# Patient Record
Sex: Male | Born: 1990 | Race: Black or African American | Hispanic: No | Marital: Single | Smoking: Current every day smoker
Health system: Southern US, Community
[De-identification: ages and names within clinical notes are randomized; demographics above are authoritative.]

## PROBLEM LIST (undated history)

## (undated) ENCOUNTER — Ambulatory Visit (HOSPITAL_COMMUNITY): Admission: EM | Payer: Medicaid Other

---
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
## 2012-08-12 ENCOUNTER — Encounter (HOSPITAL_COMMUNITY): Payer: Self-pay | Admitting: *Deleted

## 2012-08-12 ENCOUNTER — Emergency Department (HOSPITAL_COMMUNITY): Payer: Self-pay

## 2012-08-12 ENCOUNTER — Emergency Department (HOSPITAL_COMMUNITY)
Admission: EM | Admit: 2012-08-12 | Discharge: 2012-08-12 | Disposition: A | Discharge status: Home / Self Care | Payer: Self-pay | Attending: Emergency Medicine | Admitting: Emergency Medicine

## 2012-08-12 DIAGNOSIS — R509 Fever, unspecified: Secondary | ICD-10-CM | POA: Insufficient documentation

## 2012-08-12 DIAGNOSIS — IMO0001 Reserved for inherently not codable concepts without codable children: Secondary | ICD-10-CM | POA: Insufficient documentation

## 2012-08-12 DIAGNOSIS — R51 Headache: Secondary | ICD-10-CM | POA: Insufficient documentation

## 2012-08-12 DIAGNOSIS — R1110 Vomiting, unspecified: Secondary | ICD-10-CM | POA: Insufficient documentation

## 2012-08-12 DIAGNOSIS — F17200 Nicotine dependence, unspecified, uncomplicated: Secondary | ICD-10-CM | POA: Insufficient documentation

## 2012-08-12 DIAGNOSIS — J3489 Other specified disorders of nose and nasal sinuses: Secondary | ICD-10-CM | POA: Insufficient documentation

## 2012-08-12 DIAGNOSIS — B9789 Other viral agents as the cause of diseases classified elsewhere: Secondary | ICD-10-CM | POA: Insufficient documentation

## 2012-08-12 DIAGNOSIS — R197 Diarrhea, unspecified: Secondary | ICD-10-CM | POA: Insufficient documentation

## 2012-08-12 DIAGNOSIS — B349 Viral infection, unspecified: Secondary | ICD-10-CM

## 2012-08-12 MED ORDER — OXYCODONE-ACETAMINOPHEN 5-325 MG PO TABS
2.0000 | ORAL_TABLET | Freq: Once | ORAL | Status: AC
  Administered 2012-08-12: 1 via ORAL
  Filled 2012-08-12: qty 1

## 2012-08-12 MED ORDER — ONDANSETRON 4 MG PO TBDP
4.0000 mg | ORAL_TABLET | Freq: Once | ORAL | Status: AC
  Administered 2012-08-12: 4 mg via ORAL
  Filled 2012-08-12: qty 1

## 2012-08-12 MED ORDER — ONDANSETRON HCL 4 MG PO TABS: 4.0000 mg | ORAL_TABLET | Freq: Three times a day (TID) | ORAL | Status: DC | PRN

## 2012-08-12 MED ORDER — OXYCODONE-ACETAMINOPHEN 5-325 MG PO TABS: ORAL_TABLET | ORAL | Status: DC

## 2012-08-12 NOTE — ED Provider Notes (Signed)
History     CSN: [id number]  Arrival date & time 08/12/12  5284   First MD Initiated Contact with Patient 08/12/12 1037      Chief Complaint  Patient presents with  . Influenza    (Consider location/radiation/quality/duration/timing/severity/associated sxs/prior treatment) HPI  TANDRE CONLY is a 21 y.o. male complaining of sore throat, HA (5/`10, generalized), rhinorrhea,  f/c/n/v/d starting 4 days ago. Pt think he has had flu shot this year. Denies SOB, sick contacts, CP.   History reviewed. No pertinent past medical history.  History reviewed. No pertinent past surgical history.  History reviewed. No pertinent family history.  History  Substance Use Topics  . Smoking status: Current Every Day Smoker    Types: Cigarettes  . Smokeless tobacco: Not on file  . Alcohol Use: Yes      Review of Systems  Constitutional: Positive for fever.  HENT: Positive for rhinorrhea.   Gastrointestinal: Positive for vomiting and diarrhea.  Musculoskeletal: Positive for myalgias.  Neurological: Positive for headaches.  All other systems reviewed and are negative.    Allergies  Review of patient's allergies indicates no known allergies.  Home Medications  No current outpatient prescriptions on file.  BP 103/66  Pulse 82  Temp 98.1 F (36.7 C) (Oral)  Resp 20  SpO2 98%  Physical Exam  Nursing note and vitals reviewed. Constitutional: He is oriented to person, place, and time. He appears well-developed and well-nourished. No distress.  HENT:  Head: Normocephalic.  Eyes: Conjunctivae normal and EOM are normal.  Neck: Normal range of motion. Neck supple.       No Meningeal signs  Cardiovascular: Normal rate.   Pulmonary/Chest: Effort normal and breath sounds normal. No stridor. No respiratory distress. He has no wheezes. He has no rales. He exhibits no tenderness.  Abdominal: Soft. Bowel sounds are normal. He exhibits no distension and no mass. There is no tenderness.  There is no rebound and no guarding.  Musculoskeletal: Normal range of motion.  Neurological: He is alert and oriented to person, place, and time.  Psychiatric: He has a normal mood and affect.    ED Course  Procedures (including critical care time)  Labs Reviewed - No data to display No results found.   1. Viral syndrome       MDM   Patient refused IV or blood draw.  Lung sounds clear to auscultation. Patient is tolerating by mouth. Chest x-ray shows no infiltrate. He is afebrile at this time. Will attempt pain control and encourage oral hydration. He is outside the 48 hour window for Tamiflu.  Discussed case with attending who agrees with plan and stability to d/c to home.     Pt verbalized understanding and agrees with care plan. Outpatient follow-up and return precautions given.     New Prescriptions   ONDANSETRON (ZOFRAN) 4 MG TABLET    Take 1 tablet (4 mg total) by mouth every 8 (eight) hours as needed for nausea.   OXYCODONE-ACETAMINOPHEN (PERCOCET/ROXICET) 5-325 MG PER TABLET    1 to 2 tabs PO q6hrs  PRN for pain          Wynetta Emery, PA-C 08/12/12 1144

## 2012-08-12 NOTE — ED Notes (Signed)
Pt reports flu like symptoms since Thursday. Having n/v/d, bodyaches, fatigue and fever/chills. Mask on pt at triage.

## 2012-08-12 NOTE — ED Provider Notes (Signed)
Medical screening examination/treatment/procedure(s) were performed by non-physician practitioner and as supervising physician I was immediately available for consultation/collaboration.   Glynn Octave, MD 08/12/12 [phone]

## 2012-11-21 ENCOUNTER — Emergency Department (HOSPITAL_COMMUNITY)
Admission: EM | Admit: 2012-11-21 | Discharge: 2012-11-21 | Disposition: A | Discharge status: Home / Self Care | Payer: Self-pay | Attending: Emergency Medicine | Admitting: Emergency Medicine

## 2012-11-21 ENCOUNTER — Encounter (HOSPITAL_COMMUNITY): Payer: Self-pay | Admitting: Emergency Medicine

## 2012-11-21 DIAGNOSIS — N489 Disorder of penis, unspecified: Secondary | ICD-10-CM

## 2012-11-21 DIAGNOSIS — R300 Dysuria: Secondary | ICD-10-CM | POA: Insufficient documentation

## 2012-11-21 DIAGNOSIS — Z79899 Other long term (current) drug therapy: Secondary | ICD-10-CM | POA: Insufficient documentation

## 2012-11-21 DIAGNOSIS — N4889 Other specified disorders of penis: Secondary | ICD-10-CM | POA: Insufficient documentation

## 2012-11-21 DIAGNOSIS — N342 Other urethritis: Secondary | ICD-10-CM

## 2012-11-21 DIAGNOSIS — F17200 Nicotine dependence, unspecified, uncomplicated: Secondary | ICD-10-CM | POA: Insufficient documentation

## 2012-11-21 MED ORDER — ONDANSETRON 4 MG PO TBDP
4.0000 mg | ORAL_TABLET | Freq: Once | ORAL | Status: AC
  Administered 2012-11-21: 8 mg via ORAL

## 2012-11-21 MED ORDER — CEFTRIAXONE SODIUM 250 MG IJ SOLR
250.0000 mg | Freq: Once | INTRAMUSCULAR | Status: AC
  Administered 2012-11-21: 250 mg via INTRAMUSCULAR
  Filled 2012-11-21: qty 250

## 2012-11-21 MED ORDER — AZITHROMYCIN 250 MG PO TABS
2000.0000 mg | ORAL_TABLET | Freq: Once | ORAL | Status: AC
  Administered 2012-11-21: 2000 mg via ORAL
  Filled 2012-11-21: qty 8

## 2012-11-21 MED ORDER — PENICILLIN G BENZATHINE 1200000 UNIT/2ML IM SUSP
2.4000 10*6.[IU] | Freq: Once | INTRAMUSCULAR | Status: AC
  Administered 2012-11-21: 2.4 10*6.[IU] via INTRAMUSCULAR
  Filled 2012-11-21 (×2): qty 2

## 2012-11-21 MED ORDER — ONDANSETRON 4 MG PO TBDP
ORAL_TABLET | ORAL | Status: AC
  Administered 2012-11-21: 12:00:00
  Filled 2012-11-21: qty 2

## 2012-11-21 MED ORDER — LACTINEX PO CHEW: 1.0000 | CHEWABLE_TABLET | Freq: Three times a day (TID) | ORAL | Status: DC

## 2012-11-21 MED ORDER — LIDOCAINE HCL (PF) 1 % IJ SOLN
INTRAMUSCULAR | Status: AC
  Administered 2012-11-21: 11:00:00
  Filled 2012-11-21: qty 5

## 2012-11-21 MED ORDER — ONDANSETRON HCL 4 MG PO TABS: 4.0000 mg | ORAL_TABLET | Freq: Three times a day (TID) | ORAL | Status: DC | PRN

## 2012-11-21 MED ORDER — ONDANSETRON HCL 8 MG PO TABS: 4.0000 mg | ORAL_TABLET | Freq: Once | ORAL | Status: DC

## 2012-11-21 NOTE — ED Notes (Signed)
Pt gagging and spitting in trash can. States he "got hot and belly hurts".

## 2012-11-21 NOTE — ED Provider Notes (Signed)
Medical screening examination/treatment/procedure(s) were performed by non-physician practitioner and as supervising physician I was immediately available for consultation/collaboration.   Vannak Montenegro, MD 11/21/12 1541 

## 2012-11-21 NOTE — ED Notes (Signed)
Pt states he is feeling better.  
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
## 2012-11-21 NOTE — ED Notes (Signed)
Pt sitting on toilet feeling like he needs to have a BM

## 2012-11-22 LAB — GC/CHLAMYDIA PROBE AMP: GC Probe RNA: NEGATIVE

## 2012-11-23 NOTE — ED Notes (Signed)
+   Chlamydia  Patient treated with Rocephin and Zithromax-Chart appended per protocol MD.

## 2012-11-28 ENCOUNTER — Telehealth (HOSPITAL_COMMUNITY): Payer: Self-pay | Admitting: Emergency Medicine

## 2012-11-28 NOTE — ED Notes (Signed)
LVM requesting call back.

## 2012-12-01 ENCOUNTER — Telehealth (HOSPITAL_COMMUNITY): Payer: Self-pay | Admitting: Emergency Medicine

## 2013-07-24 ENCOUNTER — Encounter (HOSPITAL_COMMUNITY): Payer: Self-pay | Admitting: Emergency Medicine

## 2013-07-24 ENCOUNTER — Emergency Department (HOSPITAL_COMMUNITY)
Admission: EM | Admit: 2013-07-24 | Discharge: 2013-07-24 | Discharge status: Against Medical Advice | Payer: Self-pay | Attending: Emergency Medicine | Admitting: Emergency Medicine

## 2013-07-24 DIAGNOSIS — R1110 Vomiting, unspecified: Secondary | ICD-10-CM | POA: Insufficient documentation

## 2013-07-24 DIAGNOSIS — R05 Cough: Secondary | ICD-10-CM | POA: Insufficient documentation

## 2013-07-24 DIAGNOSIS — N509 Disorder of male genital organs, unspecified: Secondary | ICD-10-CM | POA: Insufficient documentation

## 2013-07-24 DIAGNOSIS — R51 Headache: Secondary | ICD-10-CM | POA: Insufficient documentation

## 2013-07-24 DIAGNOSIS — R059 Cough, unspecified: Secondary | ICD-10-CM | POA: Insufficient documentation

## 2013-07-24 DIAGNOSIS — F17200 Nicotine dependence, unspecified, uncomplicated: Secondary | ICD-10-CM | POA: Insufficient documentation

## 2013-07-24 LAB — COMPREHENSIVE METABOLIC PANEL
AST: 18 U/L (ref 0–37)
Alkaline Phosphatase: 53 U/L (ref 39–117)
Calcium: 9.6 mg/dL (ref 8.4–10.5)
GFR calc non Af Amer: 70 mL/min — ABNORMAL LOW (ref >90)
Sodium: 139 mEq/L (ref 135–145)
Total Bilirubin: 0.7 mg/dL (ref 0.3–1.2)
Total Protein: 7.6 g/dL (ref 6.0–8.3)

## 2013-07-24 LAB — CBC WITH DIFFERENTIAL/PLATELET
Eosinophils Absolute: 0 10*3/uL (ref 0.0–0.7)
Eosinophils Relative: 0 % (ref 0–5)
Lymphs Abs: 1.1 10*3/uL (ref 0.7–4.0)
MCH: 31.7 pg (ref 26.0–34.0)
Monocytes Absolute: 0.7 10*3/uL (ref 0.1–1.0)
Neutrophils Relative %: 80 % — ABNORMAL HIGH (ref 43–77)
Platelets: 174 10*3/uL (ref 150–400)
RBC: 5.4 MIL/uL (ref 4.22–5.81)

## 2013-07-24 LAB — URINALYSIS, ROUTINE W REFLEX MICROSCOPIC
Glucose, UA: NEGATIVE mg/dL
Leukocytes, UA: NEGATIVE
Nitrite: NEGATIVE
Specific Gravity, Urine: 1.027 (ref 1.005–1.030)
pH: 5.5 (ref 5.0–8.0)

## 2013-07-24 MED ORDER — ONDANSETRON 4 MG PO TBDP
8.0000 mg | ORAL_TABLET | Freq: Once | ORAL | Status: AC
  Administered 2013-07-24: 8 mg via ORAL
  Filled 2013-07-24: qty 2

## 2013-07-24 NOTE — ED Notes (Signed)
No answer when called 

## 2013-07-24 NOTE — ED Notes (Signed)
Pt at desk stating he was leaving due to wait time, encouraged to stay, pt ambulatory without distress to door

## 2013-07-24 NOTE — ED Notes (Signed)
The pt has been vomiting since this afternoon coughing  Also.  C/o testicle pain headache also

## 2013-07-24 NOTE — ED Notes (Signed)
Pt vomiting in triage 

## 2013-07-26 LAB — CULTURE, GROUP A STREP

## 2014-01-16 ENCOUNTER — Emergency Department (HOSPITAL_COMMUNITY)
Admission: EM | Admit: 2014-01-16 | Discharge: 2014-01-16 | Disposition: A | Discharge status: Home / Self Care | Payer: Self-pay | Attending: Emergency Medicine | Admitting: Emergency Medicine

## 2014-01-16 ENCOUNTER — Encounter (HOSPITAL_COMMUNITY): Payer: Self-pay | Admitting: Emergency Medicine

## 2014-01-16 DIAGNOSIS — Y9389 Activity, other specified: Secondary | ICD-10-CM | POA: Insufficient documentation

## 2014-01-16 DIAGNOSIS — F17200 Nicotine dependence, unspecified, uncomplicated: Secondary | ICD-10-CM | POA: Insufficient documentation

## 2014-01-16 DIAGNOSIS — W268XXA Contact with other sharp object(s), not elsewhere classified, initial encounter: Secondary | ICD-10-CM | POA: Insufficient documentation

## 2014-01-16 DIAGNOSIS — L03012 Cellulitis of left finger: Secondary | ICD-10-CM

## 2014-01-16 DIAGNOSIS — IMO0002 Reserved for concepts with insufficient information to code with codable children: Secondary | ICD-10-CM | POA: Insufficient documentation

## 2014-01-16 DIAGNOSIS — Z79899 Other long term (current) drug therapy: Secondary | ICD-10-CM | POA: Insufficient documentation

## 2014-01-16 DIAGNOSIS — S61209A Unspecified open wound of unspecified finger without damage to nail, initial encounter: Secondary | ICD-10-CM | POA: Insufficient documentation

## 2014-01-16 DIAGNOSIS — Y9289 Other specified places as the place of occurrence of the external cause: Secondary | ICD-10-CM | POA: Insufficient documentation

## 2014-01-16 MED ORDER — CEPHALEXIN 250 MG PO CAPS
500.0000 mg | ORAL_CAPSULE | Freq: Once | ORAL | Status: AC
  Administered 2014-01-16: 500 mg via ORAL
  Filled 2014-01-16: qty 2

## 2014-01-16 MED ORDER — CEPHALEXIN 500 MG PO CAPS: 500.0000 mg | ORAL_CAPSULE | Freq: Four times a day (QID) | ORAL | Status: DC

## 2014-01-16 MED ORDER — TRAMADOL HCL 50 MG PO TABS
50.0000 mg | ORAL_TABLET | Freq: Four times a day (QID) | ORAL | Status: DC | PRN
Start: 2014-01-16 — End: 2014-12-29

## 2014-01-16 MED ORDER — TETANUS-DIPHTH-ACELL PERTUSSIS 5-2.5-18.5 LF-MCG/0.5 IM SUSP
0.5000 mL | Freq: Once | INTRAMUSCULAR | Status: DC
Start: 2014-01-16 — End: 2014-01-16
  Filled 2014-01-16: qty 0.5

## 2014-01-16 NOTE — ED Provider Notes (Signed)
Medical screening examination/treatment/procedure(s) were performed by non-physician practitioner and as supervising physician I was immediately available for consultation/collaboration.   EKG Interpretation None        Junius ArgyleForrest S Ennis Delpozo, MD 01/16/14 1950

## 2014-01-16 NOTE — Discharge Instructions (Signed)
Take Tramadol as needed for pain. Take keflex as directed until gone. Refer to attached documents for more information. Return to the ED with worsening or concerning symptoms.
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
## 2014-01-16 NOTE — ED Notes (Signed)
Left index finger swollen after hurting it 5 days ago  Cut on fence

## 2014-01-17 NOTE — Discharge Planning (Signed)
[facility] Liaison did not get to see the patient, [facility] orange card information will be mailed to the address listed.  °  ° ° °

## 2014-02-01 ENCOUNTER — Emergency Department (HOSPITAL_COMMUNITY): Payer: No Typology Code available for payment source

## 2014-02-01 ENCOUNTER — Encounter (HOSPITAL_COMMUNITY): Payer: Self-pay | Admitting: Emergency Medicine

## 2014-02-01 ENCOUNTER — Emergency Department (HOSPITAL_COMMUNITY)
Admission: EM | Admit: 2014-02-01 | Discharge: 2014-02-01 | Disposition: A | Discharge status: Home / Self Care | Payer: No Typology Code available for payment source | Attending: Emergency Medicine | Admitting: Emergency Medicine

## 2014-02-01 DIAGNOSIS — S239XXA Sprain of unspecified parts of thorax, initial encounter: Secondary | ICD-10-CM | POA: Insufficient documentation

## 2014-02-01 DIAGNOSIS — F17200 Nicotine dependence, unspecified, uncomplicated: Secondary | ICD-10-CM | POA: Insufficient documentation

## 2014-02-01 DIAGNOSIS — S335XXA Sprain of ligaments of lumbar spine, initial encounter: Secondary | ICD-10-CM | POA: Insufficient documentation

## 2014-02-01 DIAGNOSIS — S0081XA Abrasion of other part of head, initial encounter: Secondary | ICD-10-CM

## 2014-02-01 DIAGNOSIS — S53401A Unspecified sprain of right elbow, initial encounter: Secondary | ICD-10-CM

## 2014-02-01 DIAGNOSIS — S7010XA Contusion of unspecified thigh, initial encounter: Secondary | ICD-10-CM | POA: Insufficient documentation

## 2014-02-01 DIAGNOSIS — Z79899 Other long term (current) drug therapy: Secondary | ICD-10-CM | POA: Insufficient documentation

## 2014-02-01 DIAGNOSIS — Z792 Long term (current) use of antibiotics: Secondary | ICD-10-CM | POA: Insufficient documentation

## 2014-02-01 DIAGNOSIS — S39012A Strain of muscle, fascia and tendon of lower back, initial encounter: Secondary | ICD-10-CM

## 2014-02-01 DIAGNOSIS — S29019A Strain of muscle and tendon of unspecified wall of thorax, initial encounter: Secondary | ICD-10-CM

## 2014-02-01 DIAGNOSIS — Y9389 Activity, other specified: Secondary | ICD-10-CM | POA: Insufficient documentation

## 2014-02-01 DIAGNOSIS — S0990XA Unspecified injury of head, initial encounter: Secondary | ICD-10-CM

## 2014-02-01 DIAGNOSIS — S139XXA Sprain of joints and ligaments of unspecified parts of neck, initial encounter: Secondary | ICD-10-CM | POA: Insufficient documentation

## 2014-02-01 DIAGNOSIS — S161XXA Strain of muscle, fascia and tendon at neck level, initial encounter: Secondary | ICD-10-CM

## 2014-02-01 DIAGNOSIS — IMO0002 Reserved for concepts with insufficient information to code with codable children: Secondary | ICD-10-CM | POA: Insufficient documentation

## 2014-02-01 DIAGNOSIS — Y92410 Unspecified street and highway as the place of occurrence of the external cause: Secondary | ICD-10-CM | POA: Insufficient documentation

## 2014-02-01 DIAGNOSIS — S298XXA Other specified injuries of thorax, initial encounter: Secondary | ICD-10-CM | POA: Insufficient documentation

## 2014-02-01 DIAGNOSIS — S7011XA Contusion of right thigh, initial encounter: Secondary | ICD-10-CM

## 2014-02-01 MED ORDER — NAPROXEN 500 MG PO TABS: 500.0000 mg | ORAL_TABLET | Freq: Two times a day (BID) | ORAL | Status: DC

## 2014-02-01 MED ORDER — CYCLOBENZAPRINE HCL 10 MG PO TABS: 10.0000 mg | ORAL_TABLET | Freq: Two times a day (BID) | ORAL | Status: DC | PRN

## 2014-02-01 MED ORDER — KETOROLAC TROMETHAMINE 60 MG/2ML IM SOLN
60.0000 mg | Freq: Once | INTRAMUSCULAR | Status: AC
  Administered 2014-02-01: 60 mg via INTRAMUSCULAR
  Filled 2014-02-01: qty 2

## 2014-02-01 MED ORDER — HYDROCODONE-ACETAMINOPHEN 5-325 MG PO TABS
1.0000 | ORAL_TABLET | Freq: Once | ORAL | Status: AC
Start: 2014-02-01 — End: 2014-02-01
  Administered 2014-02-01: 1 via ORAL
  Filled 2014-02-01: qty 1

## 2014-02-01 MED ORDER — TRAMADOL HCL 50 MG PO TABS: 50.0000 mg | ORAL_TABLET | Freq: Four times a day (QID) | ORAL | Status: DC | PRN
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
## 2014-02-01 NOTE — ED Notes (Addendum)
Patient transported to X-ray 

## 2014-02-01 NOTE — ED Provider Notes (Signed)
Medical screening examination/treatment/procedure(s) were performed by non-physician practitioner and as supervising physician I was immediately available for consultation/collaboration.  Flint Melter, MD 02/01/14 [phone]
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
## 2014-04-05 ENCOUNTER — Emergency Department (HOSPITAL_COMMUNITY)
Admission: EM | Admit: 2014-04-05 | Discharge: 2014-04-05 | Disposition: A | Discharge status: Home / Self Care | Payer: No Typology Code available for payment source | Attending: Emergency Medicine | Admitting: Emergency Medicine

## 2014-04-05 ENCOUNTER — Encounter (HOSPITAL_COMMUNITY): Payer: Self-pay | Admitting: Emergency Medicine

## 2014-04-05 DIAGNOSIS — A64 Unspecified sexually transmitted disease: Secondary | ICD-10-CM | POA: Insufficient documentation

## 2014-04-05 DIAGNOSIS — F17200 Nicotine dependence, unspecified, uncomplicated: Secondary | ICD-10-CM | POA: Insufficient documentation

## 2014-04-05 DIAGNOSIS — Z792 Long term (current) use of antibiotics: Secondary | ICD-10-CM | POA: Insufficient documentation

## 2014-04-05 DIAGNOSIS — Z791 Long term (current) use of non-steroidal anti-inflammatories (NSAID): Secondary | ICD-10-CM | POA: Insufficient documentation

## 2014-04-05 DIAGNOSIS — Z79899 Other long term (current) drug therapy: Secondary | ICD-10-CM | POA: Insufficient documentation

## 2014-04-05 MED ORDER — CEFTRIAXONE SODIUM 250 MG IJ SOLR
250.0000 mg | Freq: Once | INTRAMUSCULAR | Status: AC
  Administered 2014-04-05: 250 mg via INTRAMUSCULAR
  Filled 2014-04-05: qty 250

## 2014-04-05 MED ORDER — AZITHROMYCIN 250 MG PO TABS
1000.0000 mg | ORAL_TABLET | Freq: Once | ORAL | Status: AC
  Administered 2014-04-05: 1000 mg via ORAL
  Filled 2014-04-05: qty 4

## 2014-04-05 MED ORDER — LIDOCAINE HCL (PF) 1 % IJ SOLN
INTRAMUSCULAR | Status: AC
  Administered 2014-04-05: 5 mL via INTRADERMAL
  Filled 2014-04-05: qty 5

## 2014-04-05 MED ORDER — LIDOCAINE HCL (PF) 1 % IJ SOLN
2.0000 mL | Freq: Once | INTRAMUSCULAR | Status: AC
  Administered 2014-04-05: 5 mL via INTRADERMAL

## 2014-04-05 NOTE — ED Provider Notes (Signed)
CSN: [id number]     Arrival date & time 04/05/14  [id number] History  This chart was scribed for Jaynie Crumbleatyana Fantasia Jinkins, PA-C, working with Flint MelterElliott L Wentz, MD by Chestine SporeSoijett Blue, ED Scribe. The patient was seen in room TR05C/TR05C at 7:53 PM.     Chief Complaint  Patient presents with  . SEXUALLY TRANSMITTED DISEASE     The history is provided by the patient. No language interpreter was used.   HPI Comments: Dwayne Sanchez is a 23 y.o. male who presents to the Emergency Department complaining of a STD. He states that a partner informed him that he needs to be treated for chlamydia.  He states that the partner found out yesterday. He states that he is having associated symptoms of white discharge. He states that he has had gonorrhea before. He denies lesions and any other associated symptoms. He states that he does not want the shot, he just wants the pills.   History reviewed. No pertinent past medical history. History reviewed. No pertinent past surgical history. No family history on file. History  Substance Use Topics  . Smoking status: Current Every Day Smoker    Types: Cigarettes  . Smokeless tobacco: Not on file  . Alcohol Use: Yes    Review of Systems  Genitourinary: Positive for discharge.  Skin:       No lesions.      Allergies  Review of patient's allergies indicates no known allergies.  Home Medications   Prior to Admission medications   Medication Sig Start Date End Date Taking? Authorizing Provider  albuterol (PROVENTIL HFA;VENTOLIN HFA) 108 (90 BASE) MCG/ACT inhaler Inhale 2 puffs into the lungs every 6 (six) hours as needed for wheezing.    Historical Provider, MD  cephALEXin (KEFLEX) 500 MG capsule Take 1 capsule (500 mg total) by mouth 4 (four) times daily. 01/16/14   Kaitlyn Szekalski, PA-C  cyclobenzaprine (FLEXERIL) 10 MG tablet Take 1 tablet (10 mg total) by mouth 2 (two) times daily as needed for muscle spasms. 02/01/14   Nashla Althoff A Kaysey Berndt, PA-C  naproxen  (NAPROSYN) 500 MG tablet Take 1 tablet (500 mg total) by mouth 2 (two) times daily. 02/01/14   Shaley Leavens A Moroni Nester, PA-C  traMADol (ULTRAM) 50 MG tablet Take 1 tablet (50 mg total) by mouth every 6 (six) hours as needed. 01/16/14   Kaitlyn Szekalski, PA-C  traMADol (ULTRAM) 50 MG tablet Take 1 tablet (50 mg total) by mouth every 6 (six) hours as needed. 02/01/14   Sahvannah Rieser A Domanick Cuccia, PA-C   BP 116/61  Pulse 90  Temp(Src) 97.9 F (36.6 C) (Oral)  Resp 18  SpO2 96%  Physical Exam  Nursing note and vitals reviewed. Constitutional: He is oriented to person, place, and time. He appears well-developed and well-nourished. No distress.  HENT:  Head: Normocephalic and atraumatic.  Eyes: EOM are normal.  Neck: Neck supple. No tracheal deviation present.  Cardiovascular: Normal rate.   Pulmonary/Chest: Effort normal. No respiratory distress.  Musculoskeletal: Normal range of motion.  Neurological: He is alert and oriented to person, place, and time.  Skin: Skin is warm and dry.  Psychiatric: He has a normal mood and affect. His behavior is normal.    ED Course  Procedures (including critical care time) DIAGNOSTIC STUDIES: Oxygen Saturation is 96% on room air, normal by my interpretation.    COORDINATION OF CARE: 7:55 PM-Discussed treatment plan which includes Rocephin and Zithromax with pt at bedside and pt agreed to plan.   Labs Review Labs  Reviewed - No data to display  Imaging Review No results found.   EKG Interpretation None      MDM   Final diagnoses:  STD (male)    Patient is requesting treatment for his chlamydia after finding out that one out of his girlfriend tested positive. Patient refused examination or cultures. He was treated with Rocephin 250 mg IM, Zithromax 1 g by mouth. I discussed with him and instructed him to make sure that all of his partners are informed of his diagnoses and are treated. No sex for one week. Discussed safe sex practices.   Filed  Vitals:   04/05/14 1922  BP: 116/61  Pulse: 90  Temp: 97.9 F (36.6 C)  TempSrc: Oral  Resp: 18  SpO2: 96%     I personally performed the services described in this documentation, which was scribed in my presence. The recorded information has been reviewed and is accurate.    Lottie Mussel, PA-C 04/05/14 2310

## 2014-04-05 NOTE — Discharge Instructions (Signed)
Your were treated today for your STI. Make sure to avoid intercourse for 1 week. Always use protection. Follow up with health dept.    Sexually Transmitted Disease A sexually transmitted disease (STD) is a disease or infection that may be passed (transmitted) from person to person, usually during sexual activity. This may happen by way of saliva, semen, blood, vaginal mucus, or urine. Common STDs include:   Gonorrhea.   Chlamydia.   Syphilis.   HIV and AIDS.   Genital herpes.   Hepatitis B and C.   Trichomonas.   Human papillomavirus (HPV).   Pubic lice.   Scabies.  Mites.  Bacterial vaginosis. WHAT ARE CAUSES OF STDs? An STD may be caused by bacteria, a virus, or parasites. STDs are often transmitted during sexual activity if one person is infected. However, they may also be transmitted through nonsexual means. STDs may be transmitted after:   Sexual intercourse with an infected person.   Sharing sex toys with an infected person.   Sharing needles with an infected person or using unclean piercing or tattoo needles.  Having intimate contact with the genitals, mouth, or rectal areas of an infected person.   Exposure to infected fluids during birth. WHAT ARE THE SIGNS AND SYMPTOMS OF STDs? Different STDs have different symptoms. Some people may not have any symptoms. If symptoms are present, they may include:   Painful or bloody urination.   Pain in the pelvis, abdomen, vagina, anus, throat, or eyes.   A skin rash, itching, or irritation.  Growths, ulcerations, blisters, or sores in the genital and anal areas.  Abnormal vaginal discharge with or without bad odor.   Penile discharge in men.   Fever.   Pain or bleeding during sexual intercourse.   Swollen glands in the groin area.   Yellow skin and eyes (jaundice). This is seen with hepatitis.   Swollen testicles.  Infertility.  Sores and blisters in the mouth. HOW ARE STDs  DIAGNOSED? To make a diagnosis, your health care provider may:   Take a medical history.   Perform a physical exam.   Take a sample of any discharge to examine.  Swab the throat, cervix, opening to the penis, rectum, or vagina for testing.  Test a sample of your first morning urine.   Perform blood tests.   Perform a Pap test, if this applies.   Perform a colposcopy.   Perform a laparoscopy.  HOW ARE STDs TREATED? Treatment depends on the STD. Some STDs may be treated but not cured.   Chlamydia, gonorrhea, trichomonas, and syphilis can be cured with antibiotic medicine.   Genital herpes, hepatitis, and HIV can be treated, but not cured, with prescribed medicines. The medicines lessen symptoms.   Genital warts from HPV can be treated with medicine or by freezing, burning (electrocautery), or surgery. Warts may come back.   HPV cannot be cured with medicine or surgery. However, abnormal areas may be removed from the cervix, vagina, or vulva.   If your diagnosis is confirmed, your recent sexual partners need treatment. This is true even if they are symptom-free or have a negative culture or evaluation. They should not have sex until their health care providers say it is okay. HOW CAN I REDUCE MY RISK OF GETTING AN STD? Take these steps to reduce your risk of getting an STD:  Use latex condoms, dental dams, and water-soluble lubricants during sexual activity. Do not use petroleum jelly or oils.  Avoid having multiple sex partners.  Do not have sex with someone who has other sex partners.  Do not have sex with anyone you do not know or who is at high risk for an STD.  Avoid risky sex practices that can break your skin.  Do not have sex if you have open sores on your mouth or skin.  Avoid drinking too much alcohol or taking illegal drugs. Alcohol and drugs can affect your judgment and put you in a vulnerable position.  Avoid engaging in oral and anal sex  acts.  Get vaccinated for HPV and hepatitis. If you have not received these vaccines in the past, talk to your health care provider about whether one or both might be right for you.   If you are at risk of being infected with HIV, it is recommended that you take a prescription medicine daily to prevent HIV infection. This is called pre-exposure prophylaxis (PrEP). You are considered at risk if:  You are a man who has sex with other men (MSM).  You are a heterosexual man or woman and are sexually active with more than one partner.  You take drugs by injection.  You are sexually active with a partner who has HIV.  Talk with your health care provider about whether you are at high risk of being infected with HIV. If you choose to begin PrEP, you should first be tested for HIV. You should then be tested every 3 months for as long as you are taking PrEP.  WHAT SHOULD I DO IF I THINK I HAVE AN STD?  See your health care provider.   Tell your sexual partner(s). They should be tested and treated for any STDs.  Do not have sex until your health care provider says it is okay. WHEN SHOULD I GET IMMEDIATE MEDICAL CARE? Contact your health care provider right away if:   You have severe abdominal pain.  You are a man and notice swelling or pain in your testicles.  You are a woman and notice swelling or pain in your vagina. Document Released: 11/12/2002 Document Revised: 08/27/2013 Document Reviewed: 03/12/2013 [facility] Patient Information 2015 [employer]. This information is not intended to replace advice given to you by your health care provider. Make sure you discuss any questions you have with your health care provider.

## 2014-04-05 NOTE — ED Notes (Signed)
Pt states a partner informed him he needs to be treated for Chlamydia.  Pt denies any symptoms.

## 2014-04-06 NOTE — ED Provider Notes (Signed)
Medical screening examination/treatment/procedure(s) were performed by non-physician practitioner and as supervising physician I was immediately available for consultation/collaboration.  Flint MelterElliott L Claira Jeter, MD 04/06/14 [phone]

## 2014-12-09 ENCOUNTER — Encounter (HOSPITAL_COMMUNITY): Payer: Self-pay

## 2014-12-09 ENCOUNTER — Emergency Department (HOSPITAL_COMMUNITY)
Admission: EM | Admit: 2014-12-09 | Discharge: 2014-12-09 | Disposition: A | Discharge status: Home / Self Care | Payer: Self-pay | Attending: Emergency Medicine | Admitting: Emergency Medicine

## 2014-12-09 ENCOUNTER — Encounter (HOSPITAL_COMMUNITY): Payer: Self-pay | Admitting: Emergency Medicine

## 2014-12-09 ENCOUNTER — Emergency Department (HOSPITAL_COMMUNITY): Payer: No Typology Code available for payment source

## 2014-12-09 ENCOUNTER — Emergency Department (HOSPITAL_COMMUNITY): Payer: Self-pay

## 2014-12-09 ENCOUNTER — Emergency Department (HOSPITAL_COMMUNITY)
Admission: EM | Admit: 2014-12-09 | Discharge: 2014-12-09 | Discharge status: Against Medical Advice | Payer: Self-pay | Attending: Emergency Medicine | Admitting: Emergency Medicine

## 2014-12-09 DIAGNOSIS — Z792 Long term (current) use of antibiotics: Secondary | ICD-10-CM | POA: Insufficient documentation

## 2014-12-09 DIAGNOSIS — M79604 Pain in right leg: Secondary | ICD-10-CM

## 2014-12-09 DIAGNOSIS — S80212A Abrasion, left knee, initial encounter: Secondary | ICD-10-CM | POA: Insufficient documentation

## 2014-12-09 DIAGNOSIS — Y9289 Other specified places as the place of occurrence of the external cause: Secondary | ICD-10-CM | POA: Insufficient documentation

## 2014-12-09 DIAGNOSIS — Y9389 Activity, other specified: Secondary | ICD-10-CM | POA: Insufficient documentation

## 2014-12-09 DIAGNOSIS — Z720 Tobacco use: Secondary | ICD-10-CM | POA: Insufficient documentation

## 2014-12-09 DIAGNOSIS — S0990XA Unspecified injury of head, initial encounter: Secondary | ICD-10-CM | POA: Insufficient documentation

## 2014-12-09 DIAGNOSIS — S3992XA Unspecified injury of lower back, initial encounter: Secondary | ICD-10-CM | POA: Insufficient documentation

## 2014-12-09 DIAGNOSIS — Z791 Long term (current) use of non-steroidal anti-inflammatories (NSAID): Secondary | ICD-10-CM | POA: Insufficient documentation

## 2014-12-09 DIAGNOSIS — S8991XA Unspecified injury of right lower leg, initial encounter: Secondary | ICD-10-CM | POA: Insufficient documentation

## 2014-12-09 DIAGNOSIS — S8992XA Unspecified injury of left lower leg, initial encounter: Secondary | ICD-10-CM | POA: Insufficient documentation

## 2014-12-09 DIAGNOSIS — Y9301 Activity, walking, marching and hiking: Secondary | ICD-10-CM | POA: Insufficient documentation

## 2014-12-09 DIAGNOSIS — Y998 Other external cause status: Secondary | ICD-10-CM | POA: Insufficient documentation

## 2014-12-09 MED ORDER — NAPROXEN 250 MG PO TABS
500.0000 mg | ORAL_TABLET | Freq: Once | ORAL | Status: AC
  Administered 2014-12-09: 500 mg via ORAL
  Filled 2014-12-09: qty 2

## 2014-12-09 MED ORDER — TRAMADOL HCL 50 MG PO TABS: 50.0000 mg | ORAL_TABLET | Freq: Four times a day (QID) | ORAL | Status: DC | PRN

## 2014-12-09 MED ORDER — HYDROCODONE-ACETAMINOPHEN 5-325 MG PO TABS
1.0000 | ORAL_TABLET | Freq: Once | ORAL | Status: AC
  Administered 2014-12-09: 1 via ORAL
  Filled 2014-12-09: qty 1

## 2014-12-09 MED ORDER — NAPROXEN 500 MG PO TABS: 500.0000 mg | ORAL_TABLET | Freq: Two times a day (BID) | ORAL | Status: DC
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
## 2014-12-09 NOTE — Discharge Instructions (Signed)
Please follow the directions provided. Use the resource guide or the referral given to establish care with a primary care doctor for follow-up. Please take naproxen twice a day to help with pain and inflammation. You may use the tramadol for pain not relieved by the naproxen. May use heat or ice on your sore areas to help with discomfort. Don't hesitate to return for any new, worsening, or concerning symptoms.   WHEN SHOULD I SEEK IMMEDIATE MEDICAL CARE?  You should get help right away if:  You have confusion or drowsiness.  You feel sick to your stomach (nauseous) or have continued, forceful vomiting.  You have dizziness or unsteadiness that is getting worse.  You have severe, continued headaches not relieved by medicine. Only take over-the-counter or prescription medicines for pain, fever, or discomfort as directed by your health care provider.  You do not have normal function of the arms or legs or are unable to walk.  You notice changes in the black spots in the center of the colored part of your eye (pupil).  You have a clear or bloody fluid coming from your nose or ears.  You have a loss of vision. During the next 24 hours after the injury, you must stay with someone who can watch you for the warning signs. This person should contact local emergency services (911 in the U.S.) if you have seizures, you become unconscious, or you are unable to wake up.   Emergency Department Resource Guide 1) Find a Doctor and Pay Out of Pocket Although you won't have to find out who is covered by your insurance plan, it is a good idea to ask around and get recommendations. You will then need to call the office and see if the doctor you have chosen will accept you as a new patient and what types of options they offer for patients who are self-pay. Some doctors offer discounts or will set up payment plans for their patients who do not have insurance, but you will need to ask so you aren't surprised when you get  to your appointment.  2) Contact Your Local Health Department Not all health departments have doctors that can see patients for sick visits, but many do, so it is worth a call to see if yours does. If you don't know where your local health department is, you can check in your phone book. The CDC also has a tool to help you locate your state's health department, and many state websites also have listings of all of their local health departments.  3) Find a Walk-in Clinic If your illness is not likely to be very severe or complicated, you may want to try a walk in clinic. These are popping up all over the country in pharmacies, drugstores, and shopping centers. They're usually staffed by nurse practitioners or physician assistants that have been trained to treat common illnesses and complaints. They're usually fairly quick and inexpensive. However, if you have serious medical issues or chronic medical problems, these are probably not your best option.  No Primary Care Doctor: - Call Health Connect at  [phone] - they can help you locate a primary care doctor that  accepts your insurance, provides certain services, etc. - Physician Referral Service- [phone]  Chronic Pain Problems: Organization         Address  Phone   Notes  [facility]  [phone]) [phone] Patients need to be referred by their primary care doctor.   Medication Assistance: Organization  Address  Phone   Notes  [facility] Medication [facility] [address] [place], [place] [address] [phone]) [phone] --Must be a resident of [place] [facility] [place] -- Must have NO insurance coverage whatsoever (no Medicaid/ Medicare, etc.) -- The pt. MUST have a primary care doctor that directs their care regularly and follows them in the community   MedAssist  [phone]) [phone]   [employer]  [phone]    Agencies that provide inexpensive medical care: Organization         Address  Phone    Notes  [facility]  [phone]) [phone]   [facility]    [phone]   [facility] [address] [place], [place] [address] [phone]) [phone]   [facility] [address], [place] [phone]) [phone]   Planned Parenthood    [phone]) [phone]   [facility]    [phone]   [facility]  [address], [place] Phone:  [phone], Fax:  [phone] Hours of Operation:  9 am - 6 pm, M-F.  Also accepts Medicaid/Medicare and self-pay.  [facility]  [address], [place] Phone: [phone], Fax: [phone]. Hours of Operation:  8:30 am - 5:30 pm, M-F.  Also accepts Medicaid and self-pay.  [facility] [place] [address], [place] Phone: [phone](336) [phone]   [facility] [address], [place], [place] [phone], Ext. [phone] Mondays & Thursdays: 7-9 AM.  First 15 patients are seen on a first come, first serve basis.    Medicaid-accepting [facility] Providers:  Organization         Address  Phone   Notes  [facility] [address], [place] [phone] Also accepts self-pay patients.  [facility] [address], [place]  [phone]) [phone]   [facility] [address], [place] [phone]) [phone]   [facility] [address] [place] [address], [place] [phone]) [phone]   Renaye RakersVeita Bland [address], [place]   [phone] Only accepts [place] Access [place]icaid patients after they have their name applied to their card.   Self-Pay (no insurance) in [facility]:  Organization         Address  Phone   Notes  Sickle Cell Patients, [facility] [address], [place] [phone]) [phone]   [facility] [address], [place] [phone]   [facility] [place]  [address], [place] [phone]) [phone]   [facility]/Dr. Osei-Bonsu  [address] [place] [address], [place] or [phone], [place] [phone]) [phone] Phone number for both [place] and [place] locations is the same.  [facility] and [facility] [address], [place] [phone]) [phone]   [facility] [place] [address] [place] [address], [place] or [address] [phone]   [facility] [address], [place] [phone](336) [phone], phone; [phone]) [phone], fax Sees patients 1st and 3rd Saturday of every month.  Must not qualify for public or private insurance (i.e. Medicaid, Medicare, [place] Health Choice, Veterans' Benefits)  Household income should be no more than 200% of the poverty level The clinic cannot treat you if you are pregnant or think you  are pregnant  Sexually transmitted diseases are not treated at the clinic.    Dental Care: Organization         Address  Phone  Notes  [facility] Department of [facility] [address], [place] [phone]) [phone] Accepts children up to age 22 who are enrolled in [place]icaid or [place] Health Choice; pregnant women with a Medicaid card; and children who have applied for Medicaid or [place] Health Choice, but were declined, whose parents can pay a reduced fee at time of service.  [facility] of [facility] [place]  [address], [place] [phone]) [phone] Accepts children up to age 22 who are enrolled in [place]icaid or [place] Health Choice; pregnant women with a Medicaid card; and children who have applied for Medicaid or [place] Health Choice, but were declined, whose parents can pay a reduced fee at time of service.  [facility]  [address], [place] [phone]) [phone] Patients are seen by appointment only. Walk-ins are not accepted. [facility] will see patients 22 years of age  and older. Monday - Tuesday (8am-5pm) Most Wednesdays (8:30-5pm) $30 per visit, cash only  [facility]  [address], [facility] [phone]) [phone] Patients are seen by appointment only. Walk-ins are not accepted. [facility] will see patients 22 years of age and older. One Wednesday Evening (Monthly: Volunteer Based).  $30 per visit, cash only  [employer] [facility]  [phone] for adults; Children under age 554, call Graduate Pediatric Dentistry at [phone]. Children aged 234-14, please call [phone] to request a pediatric application.  Dental services are provided in all areas of dental care including fillings, crowns and bridges, complete and partial dentures, implants, gum treatment, root canals, and extractions. Preventive care is also provided. Treatment is provided to both adults and children. Patients are selected via a lottery and there is often a waiting list.   [facility] [address], [place]  [phone]) [phone] [email or web address]   [facility] [address], [place], [place] [phone], Ext. [phone] Second and Fourth Thursday of each month, opens at 6:30 AM; Clinic ends at 9 AM.  Patients are seen on a first-come first-served basis, and a limited number are seen during each clinic.   [facility]  [address], [place], [place] [phone]) [phone]   Eligibility Requirements You must have lived in [place], [place], or [place] counties for at least the last three months.   You cannot be eligible for state or federal sponsored National Cityhealthcare insurance, including CIGNAVeterans Administration, [place]icaid, or Har[employer] EntertainmentMedicare.   You generally cannot be eligible for healthcare insurance through your employer.    How to apply: Eligibility screenings are held every Tuesday and Wednesday afternoon from 1:00 pm until 4:00 pm. You do not need an appointment for the interview!  [facility] [address], [place], [place] [phone]   [facility]  [phone]   [facility]  [phone]   [facility]  [phone]    Behavioral Health Resources in the Community: Intensive Outpatient Programs Organization         Address  Phone  Notes  [facility] [address], [place], [place] [phone]   [facility] [address], [place], [place] [phone]   [facility]: [facility] Drug S[facility] [address], [place], [place]  [phone]   [facility] [address],  [place], [place] [phone] or [phone]   Substance Abuse Resources Organization         Address  Phone  Notes  [facility] Drug Services  [phone]   [facility]  [phone]   [facility]  [phone]   [facility]  [phone]   Residential & Outpatient Substance Abuse Program  [phone]   Psychological Services Organization         Address  Phone  Notes  [facility]  [phone]- [phone]   [facility]  [phone]- [phone]   [facility] [address], [place] [phone] or [phone]    Mobile Crisis Teams Organization         Address  Phone  Notes  [facility], Mobile Crisis Care Unit  [phone]   [facility]  [address] [place], [place] [phone]   Doristine LocksSharon DeEsch [address] [place] [phone]    Self-Help/Support Groups Organization         Address  Phone             Notes  [facility]ental Health Assoc. of [place] - variety of support groups  336- [phone] Call for more information  Narcotics Anonymous (NA), Caring Services [address], [place]  2 meetings at this location   Statisticianesidential Treatment Programs Organization         Address  Phone  Notes  [facility] [address],    [place]  [phone]   [facility]  [address], [place] [phone], [place], [place] [phone]   [facility] [address], [place] [phone] Admissions: 8am-3pm M-F  [facility] [address]    [place], [place] [phone]   [facility] [address], [place], [place] [phone]   [facility] [address]  [place], [place] [phone]   [facility] - Intensive Outpatient [address], [place], [place] [phone]   [facility] (Addiction Recovery Care [facility] [address]  [place], [place] [phone] or [phone]   [facility] Services (RTS) [address] [place], [place] [phone] Accepts Medicaid  [facility] [address]  [place]. [place] [phone] Substance Abuse/Addiction Treatment   [facility]al Health Resources Organization         Address  Phone  Notes  [facility]  [phone]) [phone]   Angie FavaJulie Brannon, PhD [address], [address] [place], [place]   [phone]) [phone] or [phone]) [phone]   [facility]   [address] [place], [place] [phone](336) [phone]   [facility] [address], [place], [place] [phone]) [phone] Insurance/Medicaid/sponsorship through [facility]  Faith and Families [address]                                    [place], [place] [phone](336) [phone] Therapy/tele-psych/case  [facility] [address].   [place], [place] [phone]) [phone]    Dr. Lolly MustacheArfeen  [phone](336) [phone]   [facility] [place]  [employer] [facility] 1) [address], [place] 2) [address], [place] 3)  [address], [place] [phone]) [phone]) [phone]  [phone](336) [phone]   [facility] Child Abuse Hotline [phone])  [phone] or [phone] (After Hours)

## 2014-12-09 NOTE — ED Notes (Signed)
Pt was requesting stronger pain medication but informed to fill his prescription of Tramadol provided by Joni ReiningNicole, [place] but he stated that it wasn't strong enough while using vulgar profanity. Informed patient he could take ibuprofen OTC per [place]ole, [place]. Pt A&Ox4, ambulatory with steady gait but wheeled out of ED via wheelchair, NAD.
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
## 2014-12-29 ENCOUNTER — Encounter (HOSPITAL_COMMUNITY): Payer: Self-pay | Admitting: Emergency Medicine

## 2014-12-29 ENCOUNTER — Emergency Department (HOSPITAL_COMMUNITY): Payer: Self-pay

## 2014-12-29 ENCOUNTER — Emergency Department (HOSPITAL_COMMUNITY)
Admission: EM | Admit: 2014-12-29 | Discharge: 2014-12-29 | Disposition: A | Discharge status: Home / Self Care | Payer: Self-pay | Attending: Emergency Medicine | Admitting: Emergency Medicine

## 2014-12-29 DIAGNOSIS — Z79899 Other long term (current) drug therapy: Secondary | ICD-10-CM | POA: Insufficient documentation

## 2014-12-29 DIAGNOSIS — Y939 Activity, unspecified: Secondary | ICD-10-CM | POA: Insufficient documentation

## 2014-12-29 DIAGNOSIS — R404 Transient alteration of awareness: Secondary | ICD-10-CM | POA: Insufficient documentation

## 2014-12-29 DIAGNOSIS — W01198A Fall on same level from slipping, tripping and stumbling with subsequent striking against other object, initial encounter: Secondary | ICD-10-CM | POA: Insufficient documentation

## 2014-12-29 DIAGNOSIS — Y929 Unspecified place or not applicable: Secondary | ICD-10-CM | POA: Insufficient documentation

## 2014-12-29 DIAGNOSIS — S0181XA Laceration without foreign body of other part of head, initial encounter: Secondary | ICD-10-CM | POA: Insufficient documentation

## 2014-12-29 DIAGNOSIS — S50311A Abrasion of right elbow, initial encounter: Secondary | ICD-10-CM | POA: Insufficient documentation

## 2014-12-29 DIAGNOSIS — Z720 Tobacco use: Secondary | ICD-10-CM | POA: Insufficient documentation

## 2014-12-29 DIAGNOSIS — Z792 Long term (current) use of antibiotics: Secondary | ICD-10-CM | POA: Insufficient documentation

## 2014-12-29 DIAGNOSIS — R569 Unspecified convulsions: Secondary | ICD-10-CM | POA: Insufficient documentation

## 2014-12-29 DIAGNOSIS — Y999 Unspecified external cause status: Secondary | ICD-10-CM | POA: Insufficient documentation

## 2014-12-29 LAB — COMPREHENSIVE METABOLIC PANEL
ALBUMIN: 4 g/dL (ref 3.5–5.2)
ALK PHOS: 58 U/L (ref 39–117)
ALT: 14 U/L (ref 0–53)
AST: 25 U/L (ref 0–37)
Anion gap: 12 (ref 5–15)
BUN: 8 mg/dL (ref 6–23)
CHLORIDE: 102 mmol/L (ref 96–112)
CO2: 24 mmol/L (ref 19–32)
CREATININE: 1.54 mg/dL — AB (ref 0.50–1.35)
Calcium: 9.6 mg/dL (ref 8.4–10.5)
GFR calc Af Amer: 73 mL/min — ABNORMAL LOW (ref >90)
GFR calc non Af Amer: 63 mL/min — ABNORMAL LOW (ref >90)
Glucose, Bld: 129 mg/dL — ABNORMAL HIGH (ref 70–99)
POTASSIUM: 3.8 mmol/L (ref 3.5–5.1)
Sodium: 138 mmol/L (ref 135–145)
Total Bilirubin: 0.8 mg/dL (ref 0.3–1.2)
Total Protein: 6.8 g/dL (ref 6.0–8.3)

## 2014-12-29 LAB — URINALYSIS, ROUTINE W REFLEX MICROSCOPIC
Bilirubin Urine: NEGATIVE
Glucose, UA: NEGATIVE mg/dL
HGB URINE DIPSTICK: NEGATIVE
Ketones, ur: NEGATIVE mg/dL
Leukocytes, UA: NEGATIVE
Nitrite: NEGATIVE
PROTEIN: 30 mg/dL — AB
Specific Gravity, Urine: 1.026 (ref 1.005–1.030)
UROBILINOGEN UA: 1 mg/dL (ref 0.0–1.0)
pH: 7 (ref 5.0–8.0)

## 2014-12-29 LAB — URINE MICROSCOPIC-ADD ON

## 2014-12-29 LAB — CBC WITH DIFFERENTIAL/PLATELET
BASOS ABS: 0 10*3/uL (ref 0.0–0.1)
Basophils Relative: 0 % (ref 0–1)
Eosinophils Absolute: 0 10*3/uL (ref 0.0–0.7)
Eosinophils Relative: 0 % (ref 0–5)
HCT: 44.4 % (ref 39.0–52.0)
Hemoglobin: 15.7 g/dL (ref 13.0–17.0)
LYMPHS ABS: 1.2 10*3/uL (ref 0.7–4.0)
Lymphocytes Relative: 11 % — ABNORMAL LOW (ref 12–46)
MCH: 31.3 pg (ref 26.0–34.0)
MCHC: 35.4 g/dL (ref 30.0–36.0)
MCV: 88.6 fL (ref 78.0–100.0)
Monocytes Absolute: 0.7 10*3/uL (ref 0.1–1.0)
Monocytes Relative: 6 % (ref 3–12)
NEUTROS PCT: 83 % — AB (ref 43–77)
Neutro Abs: 9.1 10*3/uL — ABNORMAL HIGH (ref 1.7–7.7)
PLATELETS: 198 10*3/uL (ref 150–400)
RBC: 5.01 MIL/uL (ref 4.22–5.81)
RDW: 12.8 % (ref 11.5–15.5)
WBC: 11.1 10*3/uL — ABNORMAL HIGH (ref 4.0–10.5)

## 2014-12-29 LAB — RAPID URINE DRUG SCREEN, HOSP PERFORMED
Amphetamines: NOT DETECTED
BARBITURATES: NOT DETECTED
BENZODIAZEPINES: POSITIVE — AB
Cocaine: NOT DETECTED
Opiates: NOT DETECTED
Tetrahydrocannabinol: POSITIVE — AB

## 2014-12-29 LAB — CBG MONITORING, ED: Glucose-Capillary: 115 mg/dL — ABNORMAL HIGH (ref 70–99)

## 2014-12-29 LAB — ETHANOL: Alcohol, Ethyl (B): <5 mg/dL (ref 0–9)

## 2014-12-29 NOTE — ED Provider Notes (Signed)
CSN: [id number]     Arrival date & time 12/29/14  1941 History   First MD Initiated Contact with Patient 12/29/14 1953     Chief Complaint  Patient presents with  . Seizures     (Consider location/radiation/quality/duration/timing/severity/associated sxs/prior Treatment) The history is provided by the patient, medical records and the EMS personnel. No language interpreter was used.     Dwayne Sanchez is a 24 y.o. male  with no major medical hx presents to the Emergency Department complaining the EMS after witnessed seizure activity and persistent altered mental status. Per EMS witnesses state patient fell forward striking his face on the ground.  Afterwards he was found by EMS to be postictal and combative. Per EMS patient vomited a large amount in the back of the truck.  Level V caveat for altered mental status.  Patient reports drinking alcohol today but answers few other questions.   History reviewed. No pertinent past medical history. History reviewed. No pertinent past surgical history. No family history on file. History  Substance Use Topics  . Smoking status: Current Every Day Smoker    Types: Cigarettes  . Smokeless tobacco: Not on file  . Alcohol Use: Yes    Review of Systems  Unable to perform ROS: Mental status change      Allergies  Review of patient's allergies indicates no known allergies.  Home Medications   Prior to Admission medications   Medication Sig Start Date End Date Taking? Authorizing Provider  albuterol (PROVENTIL HFA;VENTOLIN HFA) 108 (90 BASE) MCG/ACT inhaler Inhale 2 puffs into the lungs every 6 (six) hours as needed for wheezing.   Yes Historical Provider, MD  cephALEXin (KEFLEX) 500 MG capsule Take 1 capsule (500 mg total) by mouth 4 (four) times daily. 01/16/14   Kaitlyn Szekalski, PA-C  traMADol (ULTRAM) 50 MG tablet Take 1 tablet (50 mg total) by mouth every 6 (six) hours as needed. 02/01/14   Tatyana Kirichenko, PA-C  traMADol (ULTRAM)  50 MG tablet Take 1 tablet (50 mg total) by mouth every 6 (six) hours as needed. 12/09/14   Harle BattiestElizabeth Tysinger, NP   BP 107/67 mmHg  Pulse 77  Resp 17  Ht 6\' 1"  (1.854 m)  Wt 152 lb (68.947 kg)  BMI 20.06 kg/m2  SpO2 99% Physical Exam  Constitutional: He appears well-developed and well-nourished. No distress.  Awake, alert, nontoxic appearance  HENT:  Head: Normocephalic and atraumatic.  Right Ear: No hemotympanum.  Left Ear: No hemotympanum.  Nose: Nose normal.  Mouth/Throat: Oropharynx is clear and moist. Mucous membranes are not dry. Normal dentition. No dental caries. No oropharyngeal exudate.  Laceration to the right side of the time with minimal bleeding No dental trauma Moist membranes  Eyes: Conjunctivae and EOM are normal. Pupils are equal, round, and reactive to light. No scleral icterus.  No horizontal, vertical or rotational nystagmus  Neck: Normal range of motion. Neck supple.  C-collar in place No midline or paraspinal tenderness  Cardiovascular: Normal rate, regular rhythm, normal heart sounds and intact distal pulses.   Pulmonary/Chest: Effort normal and breath sounds normal. No respiratory distress. He has no wheezes. He has no rales.  Equal chest expansion Clear and equal breath sounds No chest tenderness  Abdominal: Soft. Bowel sounds are normal. He exhibits no mass. There is no tenderness. There is no rebound and no guarding.  Abdomen soft and nontender  Musculoskeletal: Normal range of motion. He exhibits no edema.  Patient moves all extremities and all major joints voluntarily  Lymphadenopathy:    He has no cervical adenopathy.  Neurological: He is alert. He has normal reflexes. No cranial nerve deficit. GCS eye subscore is 4. GCS verbal subscore is 4. GCS motor subscore is 6.  Reflex Scores:      Bicep reflexes are 2+ on the right side and 2+ on the left side.      Brachioradialis reflexes are 2+ on the right side and 2+ on the left side.      Patellar  reflexes are 2+ on the right side and 2+ on the left side.      Achilles reflexes are 2+ on the right side and 2+ on the left side. GCS 14 - pt remains confused Patient with no obvious cranial nerve deficit that is minimally cooperative with evaluation 5/5 strength in bilateral upper and lower extremities; moves extremities without ataxia  Skin: Skin is warm and dry. No rash noted. He is not diaphoretic.  Abrasion to the right elbow  Psychiatric: He has a normal mood and affect. His behavior is normal. Judgment and thought content normal.  Nursing note and vitals reviewed.   ED Course  Procedures (including critical care time) Labs Review Labs Reviewed  URINE RAPID DRUG SCREEN (HOSP PERFORMED) - Abnormal; Notable for the following:    Benzodiazepines POSITIVE (*)    Tetrahydrocannabinol POSITIVE (*)    All other components within normal limits  COMPREHENSIVE METABOLIC PANEL - Abnormal; Notable for the following:    Glucose, Bld 129 (*)    Creatinine, Ser 1.54 (*)    GFR calc non Af Amer 63 (*)    GFR calc Af Amer 73 (*)    All other components within normal limits  CBC WITH DIFFERENTIAL/PLATELET - Abnormal; Notable for the following:    WBC 11.1 (*)    Neutrophils Relative % 83 (*)    Neutro Abs 9.1 (*)    Lymphocytes Relative 11 (*)    All other components within normal limits  URINALYSIS, ROUTINE W REFLEX MICROSCOPIC - Abnormal; Notable for the following:    Color, Urine AMBER (*)    Protein, ur 30 (*)    All other components within normal limits  CBG MONITORING, ED - Abnormal; Notable for the following:    Glucose-Capillary 115 (*)    All other components within normal limits  ETHANOL  URINE MICROSCOPIC-ADD ON    Imaging Review Dg Cervical Spine Complete  12/29/2014   CLINICAL DATA:  Seizures today, now with neck pain.  EXAM: CERVICAL SPINE  4+ VIEWS  COMPARISON:  None.  FINDINGS: C1 to the superior endplate of C6 is imaged on the provided lateral radiograph. There is  adequate evaluation of the C6-C7 intervertebral disc space on the provided swimmer's radiograph though there is persistent obscuration of the cervical thoracic junction secondary to overlying osseous and soft tissue structures.  Normal alignment of the cervical spine. No anterolisthesis or retrolisthesis. The bilateral facets appear normally aligned given obliquity.  The dens appears normally positioned between the lateral masses of C1.  Cervical vertebral body heights are preserved. Prevertebral soft tissues are normal.  Intervertebral disc space heights appear preserved.  The bilateral neural foramina appear widely patent given obliquity.  Regional soft tissues appear normal. Limited visualization of lung apices is normal.  IMPRESSION: No definite acute findings.   Electronically Signed   By: Simonne Come M.D.   On: 12/29/2014 21:02   Ct Head Wo Contrast  12/29/2014   CLINICAL DATA:  Seizure, headache  EXAM:  CT HEAD WITHOUT CONTRAST  TECHNIQUE: Contiguous axial images were obtained from the base of the skull through the vertex without intravenous contrast.  COMPARISON:  12/09/2014  FINDINGS: No evidence of parenchymal hemorrhage or extra-axial fluid collection. No mass lesion, mass effect, or midline shift.  No CT evidence of acute infarction.  Cerebral volume is within normal limits.  No ventriculomegaly.  The visualized paranasal sinuses are essentially clear. The mastoid air cells are unopacified.  No evidence of calvarial fracture.  IMPRESSION: Normal head CT.   Electronically Signed   By: Charline Bills M.D.   On: 12/29/2014 20:39     EKG Interpretation   Date/Time:  Monday December 29 2014 19:54:48 EDT Ventricular Rate:  93 PR Interval:  144 QRS Duration: 69 QT Interval:  334 QTC Calculation: 415 R Axis:   46 Text Interpretation:  Normal sinus rhythm Early repolarization No old  tracing to compare Confirmed by Ethelda Chick  MD, SAM [phone]) on 12/29/2014  8:01:07 PM      MDM   Final  diagnoses:  Seizure  Transient alteration of awareness   MUHANNAD BIGNELL presents with new onset seizure. He admits to alcohol usage tonight however due to his persistent altered mental status concerned about drug use as well. Will obtain head CT and lab workup.  10:29 PM Pt has returned to baseline.  He is ambulatory without gait disturbance.  He has full ROM of his neck without pain, no midline tenderness and negative x-rays.  GCS 15 at this time.  UDS positive for benzos and THC. No evidence of UTI or hypoglycemia. Labs reassuring. CT head unremarkable.  Patient now reports that he has been taking an unknown medication/drug off the street because it makes him feel high. Reports several months of routine usage of this stopping abruptly 3 days ago. He does not know the medication is.  Patient counseled on his drug and alcohol usage. Freckling close follow-up with [facility] neurology for further workup of his seizure disorder. Return to emergency department for repeat seizures.  BP 107/67 mmHg  Pulse 77  Resp 17  Ht  (1.854 m)  Wt 152 lb (68.947 kg)  BMI 20.06 kg/m2  SpO2 99%  The patient was discussed with and seen by Dr. Caprice Kluver who agrees with the treatment plan.      Dahlia Client Charlen Bakula, PA-C 12/29/14 2254  Doug Sou, MD 12/30/14 [phone]

## 2014-12-29 NOTE — ED Notes (Signed)
CBG: 115 

## 2014-12-29 NOTE — ED Provider Notes (Signed)
Patient reportedly had seizure earlier today. Tonic-clonic in nature. Patient has no recall of the event. Patient presently asymptomatic alert Glasgow Coma Score 15 HEENT exam no facial asymmetry neck supple nontender full range of motion. no distress neurologic gait normal Romberg normal pronator drift normal motor strength 5 over 5 overall Patient does not drive. He will be referred to neurology service as outpatient  Doug SouSam Cady Hafen, MD 12/29/14 2223

## 2014-12-29 NOTE — ED Notes (Signed)
Pt stable, ambulatory, denies any pain, states understanding of discharge instructions 

## 2014-12-29 NOTE — Discharge Instructions (Signed)
1. Medications: usual home medications 2. Treatment: rest, drink plenty of fluids,  3. Follow Up: Please followup with your primary doctor and [facility] in 2 days for discussion of your diagnoses and further evaluation after today's visit; if you do not have a primary care doctor use the resource guide provided to find one; Please return to the ER for her seizures, altered mental status or other concerns   Seizure, Adult A seizure is abnormal electrical activity in the brain. Seizures usually last from 30 seconds to 2 minutes. There are various types of seizures. Before a seizure, you may have a warning sensation (aura) that a seizure is about to occur. An aura may include the following symptoms:   Fear or anxiety.  Nausea.  Feeling like the room is spinning (vertigo).  Vision changes, such as seeing flashing lights or spots. Common symptoms during a seizure include:  A change in attention or behavior (altered mental status).  Convulsions with rhythmic jerking movements.  Drooling.  Rapid eye movements.  Grunting.  Loss of bladder and bowel control.  Bitter taste in the mouth.  Tongue biting. After a seizure, you may feel confused and sleepy. You may also have an injury resulting from convulsions during the seizure. HOME CARE INSTRUCTIONS   If you are given medicines, take them exactly as prescribed by your health care provider.  Keep all follow-up appointments as directed by your health care provider.  Do not swim or drive or engage in risky activity during which a seizure could cause further injury to you or others until your health care provider says it is OK.  Get adequate rest.  Teach friends and family what to do if you have a seizure. They should:  Lay you on the ground to prevent a fall.  Put a cushion under your head.  Loosen any tight clothing around your neck.  Turn you on your side. If vomiting occurs, this helps keep your airway clear.  Stay  with you until you recover.  Know whether or not you need emergency care. SEEK IMMEDIATE MEDICAL CARE IF:  The seizure lasts longer than 5 minutes.  The seizure is severe or you do not wake up immediately after the seizure.  You have an altered mental status after the seizure.  You are having more frequent or worsening seizures. Someone should drive you to the emergency department or call local emergency services (911 in [place] MAKE SURE YOU:  Understand these instructions.  Will watch your condition.  Will get help right away if you are not doing well or get worse. Document Released: 08/19/2000 Document Revised: 06/12/2013 Document Reviewed: 04/03/2013 [facility] Patient Information 2015 [place], [place]. This information is not intended to replace advice given to you by your health care provider. Make sure you discuss any questions you have with your health care provider.

## 2014-12-29 NOTE — ED Notes (Signed)
Pt arrives via [facility].  EMS reports bystanders witnessed clonic tonic seizure lasting 45 seconds, reports pt fell face down from standing. EMS reports possible post-ictal lasting 20 minutes, pt combative, pupils 2mm, pt vomited large amount in truck.  Pt AOx4, GCS 15, pupils 4mm, PERRLA.

## 2015-05-26 IMAGING — DX DG CERVICAL SPINE COMPLETE 4+V
6 series · 6 of 6 positions shown · non-contrast
Comparison: None.

CLINICAL DATA: Seizures today, now with neck pain.

EXAM:
CERVICAL SPINE  4+ VIEWS

[c-spine lat]
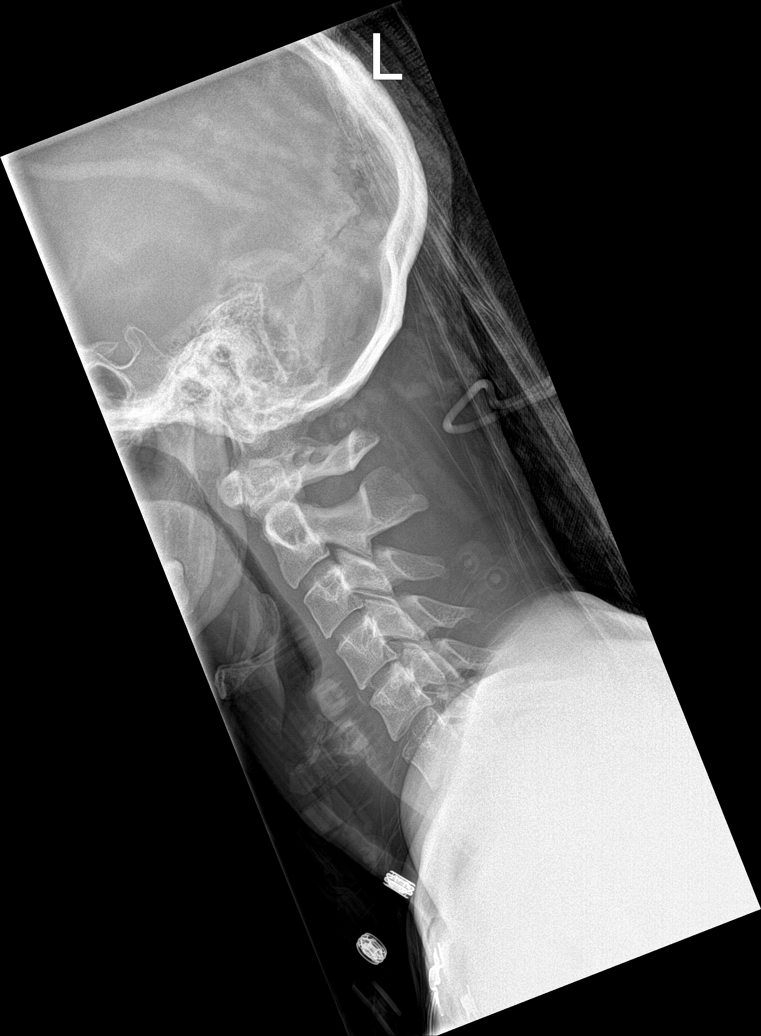

[c-spine obl (1 of 2)]
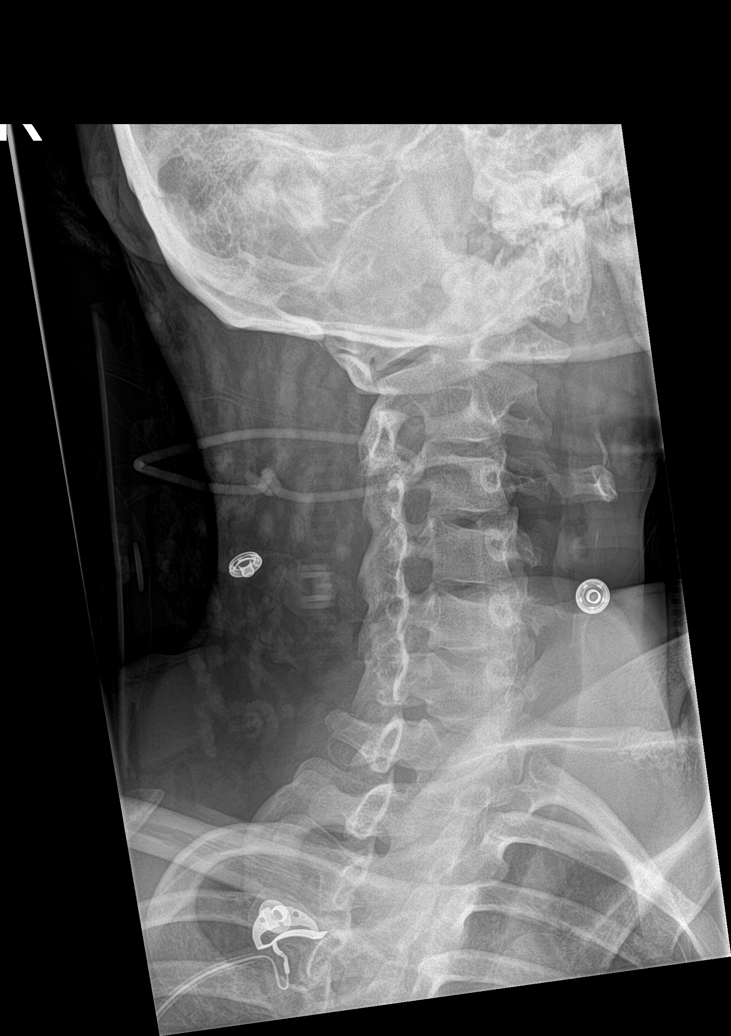

[c-spine obl (2 of 2)]
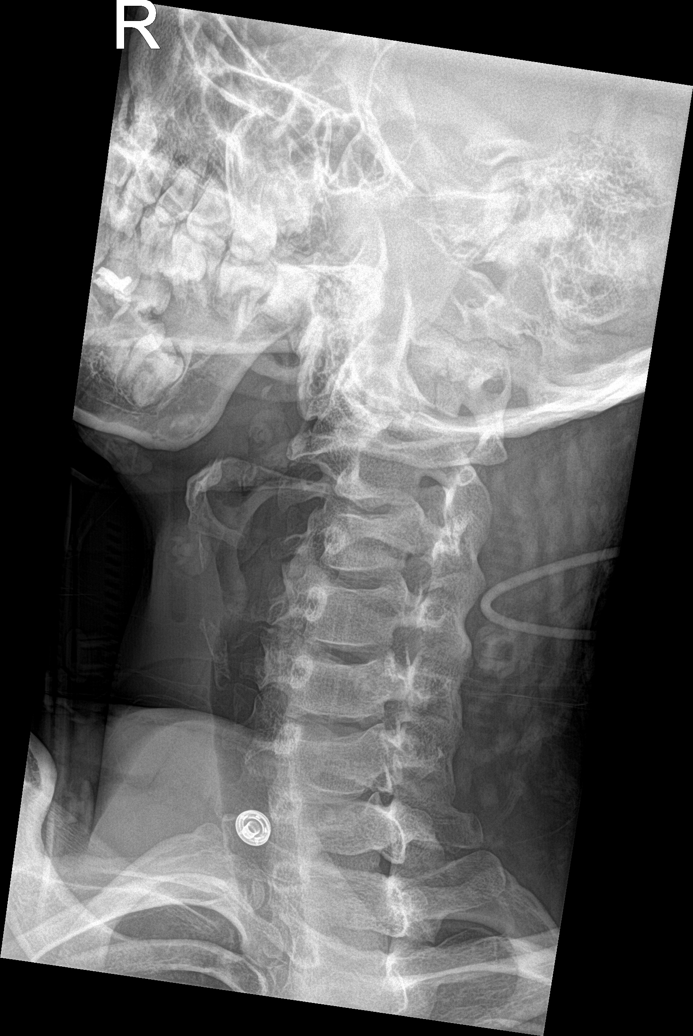

[c-spine ap]
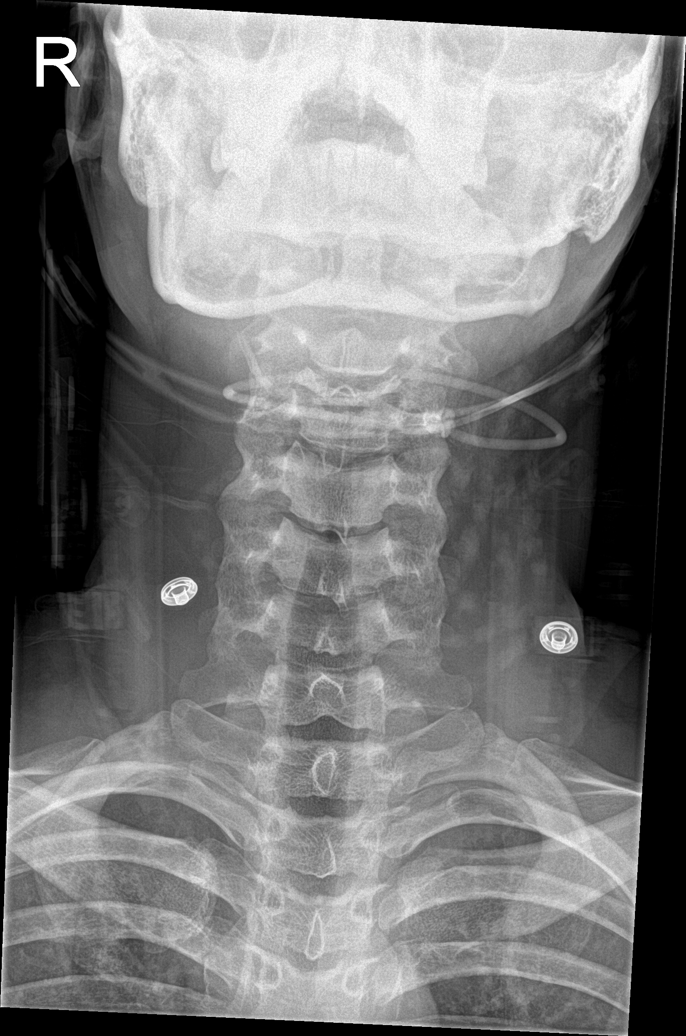

[c-spine open mouth]
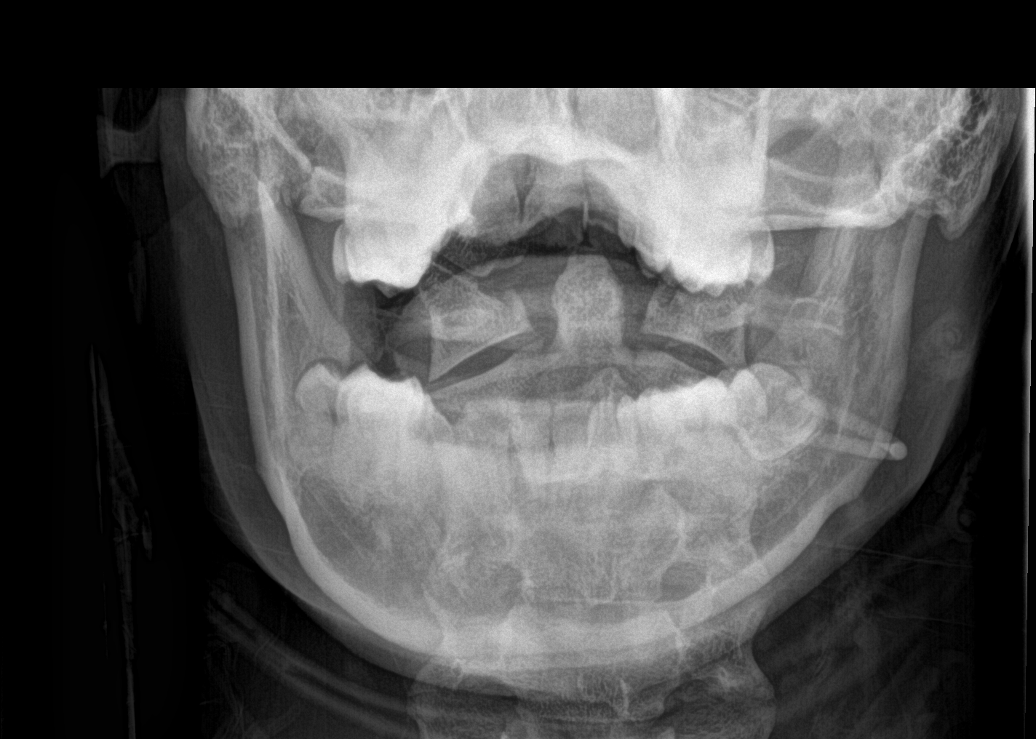

[c-spine swimmers]
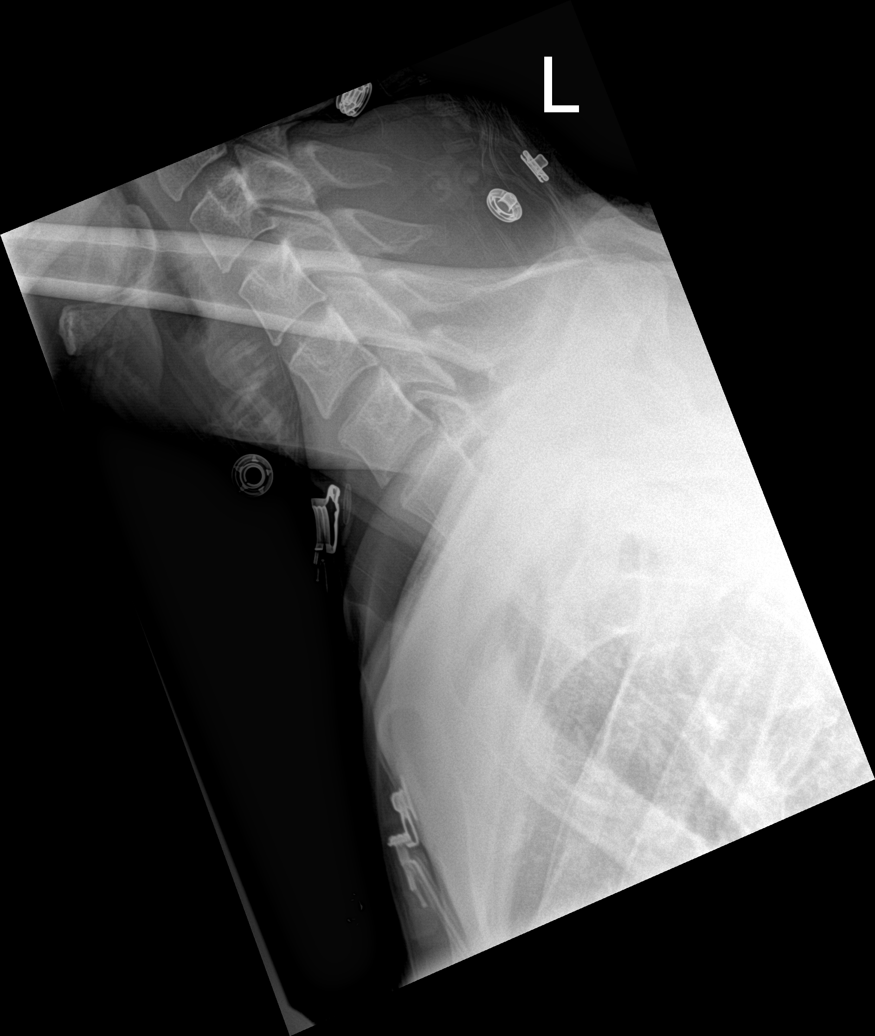

[6 of 6 positions shown; findings below may reference images not displayed]

FINDINGS: C1 to the superior endplate of C6 is imaged on the provided lateral
radiograph. There is adequate evaluation of the C6-C7 intervertebral
disc space on the provided swimmer's radiograph though there is
persistent obscuration of the cervical thoracic junction secondary
to overlying osseous and soft tissue structures.

Normal alignment of the cervical spine. No anterolisthesis or
retrolisthesis. The bilateral facets appear normally aligned given
obliquity.

The dens appears normally positioned between the lateral masses of
C1.

Cervical vertebral body heights are preserved. Prevertebral soft
tissues are normal.

Intervertebral disc space heights appear preserved.

The bilateral neural foramina appear widely patent given obliquity.

Regional soft tissues appear normal. Limited visualization of lung
apices is normal.
IMPRESSION: No definite acute findings.

## 2017-03-27 ENCOUNTER — Emergency Department (HOSPITAL_COMMUNITY): Payer: Self-pay

## 2017-03-27 ENCOUNTER — Encounter (HOSPITAL_COMMUNITY): Payer: Self-pay | Admitting: Emergency Medicine

## 2017-03-27 ENCOUNTER — Emergency Department (HOSPITAL_COMMUNITY)
Admission: EM | Admit: 2017-03-27 | Discharge: 2017-03-27 | Disposition: A | Discharge status: Home / Self Care | Payer: Self-pay | Attending: Emergency Medicine | Admitting: Emergency Medicine

## 2017-03-27 DIAGNOSIS — S0083XA Contusion of other part of head, initial encounter: Secondary | ICD-10-CM | POA: Insufficient documentation

## 2017-03-27 DIAGNOSIS — Y998 Other external cause status: Secondary | ICD-10-CM | POA: Insufficient documentation

## 2017-03-27 DIAGNOSIS — Y9383 Activity, rough housing and horseplay: Secondary | ICD-10-CM | POA: Insufficient documentation

## 2017-03-27 DIAGNOSIS — S01511A Laceration without foreign body of lip, initial encounter: Secondary | ICD-10-CM

## 2017-03-27 DIAGNOSIS — F17210 Nicotine dependence, cigarettes, uncomplicated: Secondary | ICD-10-CM | POA: Insufficient documentation

## 2017-03-27 DIAGNOSIS — W01198A Fall on same level from slipping, tripping and stumbling with subsequent striking against other object, initial encounter: Secondary | ICD-10-CM | POA: Insufficient documentation

## 2017-03-27 DIAGNOSIS — S0990XA Unspecified injury of head, initial encounter: Secondary | ICD-10-CM

## 2017-03-27 DIAGNOSIS — Y929 Unspecified place or not applicable: Secondary | ICD-10-CM | POA: Insufficient documentation

## 2017-03-27 MED ORDER — TETANUS-DIPHTH-ACELL PERTUSSIS 5-2.5-18.5 LF-MCG/0.5 IM SUSP: 0.5000 mL | Freq: Once | INTRAMUSCULAR | Status: DC

## 2017-03-27 MED ORDER — LIDOCAINE HCL (PF) 1 % IJ SOLN
5.0000 mL | Freq: Once | INTRAMUSCULAR | Status: DC
  Filled 2017-03-27: qty 5
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
## 2017-03-27 NOTE — ED Notes (Signed)
PA at bedside for suture repair. 

## 2017-03-27 NOTE — ED Notes (Signed)
Pt to radiology via stretcher.  
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
## 2017-03-27 NOTE — Discharge Instructions (Signed)
You need to have your sutures removed in 5 days.

## 2017-03-27 NOTE — ED Notes (Signed)
Pt and visitor not in room when this RN in to reassess and d/c

## 2017-03-31 ENCOUNTER — Encounter (HOSPITAL_COMMUNITY): Payer: Self-pay | Admitting: Emergency Medicine

## 2017-03-31 ENCOUNTER — Emergency Department (HOSPITAL_COMMUNITY)
Admission: EM | Admit: 2017-03-31 | Discharge: 2017-03-31 | Disposition: A | Discharge status: Home / Self Care | Payer: Self-pay | Attending: Emergency Medicine | Admitting: Emergency Medicine

## 2017-03-31 DIAGNOSIS — F17210 Nicotine dependence, cigarettes, uncomplicated: Secondary | ICD-10-CM | POA: Insufficient documentation

## 2017-03-31 DIAGNOSIS — Z79899 Other long term (current) drug therapy: Secondary | ICD-10-CM | POA: Insufficient documentation

## 2017-03-31 DIAGNOSIS — S0181XD Laceration without foreign body of other part of head, subsequent encounter: Secondary | ICD-10-CM | POA: Insufficient documentation

## 2017-03-31 DIAGNOSIS — Z4802 Encounter for removal of sutures: Secondary | ICD-10-CM

## 2017-03-31 DIAGNOSIS — X58XXXD Exposure to other specified factors, subsequent encounter: Secondary | ICD-10-CM | POA: Insufficient documentation

## 2017-03-31 NOTE — ED Notes (Signed)
Suture removal kit at bedside 

## 2017-03-31 NOTE — ED Provider Notes (Signed)
[facility] Provider Note   CSN: [id number] Arrival date & time: 03/31/17  [id number]     History   Chief Complaint Chief Complaint  Patient presents with  . Suture / Staple Removal    HPI Dwayne Sanchez is a 26 y.o. male.  The history is provided by the patient. No language interpreter was used.  Suture / Staple Removal  This is a new problem. The current episode started more than 1 week ago. The problem occurs constantly. Pertinent negatives include no shortness of breath. Nothing aggravates the symptoms. Nothing relieves the symptoms. He has tried nothing for the symptoms. The treatment provided no relief.    History reviewed. No pertinent past medical history.  There are no active problems to display for this patient.   History reviewed. No pertinent surgical history.     Home Medications    Prior to Admission medications   Medication Sig Start Date End Date Taking? Authorizing Provider  albuterol (PROVENTIL HFA;VENTOLIN HFA) 108 (90 BASE) MCG/ACT inhaler Inhale 2 puffs into the lungs every 6 (six) hours as needed for wheezing.    [provider]  cephALEXin (KEFLEX) 500 MG capsule Take 1 capsule (500 mg total) by mouth 4 (four) times daily. 01/16/14   Szekalski, Kaitlyn, PA-C  traMADol (ULTRAM) 50 MG tablet Take 1 tablet (50 mg total) by mouth every 6 (six) hours as needed. 02/01/14   Kirichenko, Tatyana, PA-C  traMADol (ULTRAM) 50 MG tablet Take 1 tablet (50 mg total) by mouth every 6 (six) hours as needed. 12/09/14   Harle Battiestysinger, Elizabeth, NP    Family History No family history on file.  Social History Social History  Substance Use Topics  . Smoking status: Current Every Day Smoker    Types: Cigarettes  . Smokeless tobacco: Never Used  . Alcohol use Yes     Allergies   Patient has no known allergies.   Review of Systems Review of Systems  Respiratory: Negative for shortness of breath.   All other systems reviewed and are  negative.    Physical Exam Updated Vital Signs BP 130/85 (BP Location: Left Arm)   Pulse 98   Temp 98.5 F (36.9 C)   Resp 16   Ht 5\' 9"  (1.753 m)   Wt 68 kg (150 lb)   SpO2 97%   BMI 22.15 kg/m   Physical Exam  Constitutional: He appears well-developed and well-nourished.  Neurological: He is alert.  Skin: Skin is warm.  Healed laceration face sutures in place   Psychiatric: He has a normal mood and affect.  Nursing note and vitals reviewed.    ED Treatments / Results  Labs (all labs ordered are listed, but only abnormal results are displayed) Labs Reviewed - No data to display  EKG  EKG Interpretation None       Radiology No results found.  Procedures Procedures (including critical care time)  Medications Ordered in ED Medications - No data to display   Initial Impression / Assessment and Plan / ED Course  I have reviewed the triage vital signs and the nursing notes.  Pertinent labs & imaging results that were available during my care of the patient were reviewed by me and considered in my medical decision making (see chart for details).     Sutures removed  Final Clinical Impressions(s) / ED Diagnoses   Final diagnoses:  Visit for suture removal    New Prescriptions New Prescriptions   No medications on file  An After Visit  Summary was printed and given to the patient.   Osie CheeksSofia, Didi Ganaway K, PA-C 03/31/17 Marletta Lor2003    Belfi, Melanie, MD 03/31/17 [phone]

## 2017-03-31 NOTE — Discharge Instructions (Signed)
Return if any problems.

## 2017-03-31 NOTE — ED Triage Notes (Signed)
Pt here for suture removal to R upper lip

## 2018-09-18 ENCOUNTER — Other Ambulatory Visit: Payer: Self-pay

## 2018-09-18 ENCOUNTER — Emergency Department (HOSPITAL_COMMUNITY)
Admission: EM | Admit: 2018-09-18 | Discharge: 2018-09-18 | Disposition: A | Discharge status: Home / Self Care | Payer: Self-pay | Attending: Emergency Medicine | Admitting: Emergency Medicine

## 2018-09-18 DIAGNOSIS — A6001 Herpesviral infection of penis: Secondary | ICD-10-CM | POA: Insufficient documentation

## 2018-09-18 DIAGNOSIS — F17210 Nicotine dependence, cigarettes, uncomplicated: Secondary | ICD-10-CM | POA: Insufficient documentation

## 2018-09-18 DIAGNOSIS — Z79899 Other long term (current) drug therapy: Secondary | ICD-10-CM | POA: Insufficient documentation

## 2018-09-18 MED ORDER — ACYCLOVIR 400 MG PO TABS: 400.0000 mg | ORAL_TABLET | Freq: Three times a day (TID) | ORAL | 1 refills | Status: AC

## 2018-09-18 MED ORDER — METRONIDAZOLE 500 MG PO TABS
2000.0000 mg | ORAL_TABLET | Freq: Once | ORAL | Status: AC
  Administered 2018-09-18: 2000 mg via ORAL
  Filled 2018-09-18: qty 4

## 2018-09-18 NOTE — ED Provider Notes (Signed)
[facility] EMERGENCY DEPARTMENT Provider Note   CSN: [id number] Arrival date & time: 09/18/18  1118     History   Chief Complaint No chief complaint on file.   HPI Dwayne Sanchez is a 28 y.o. male.  HPI   28 year old male presents today with complaints of rash.  Patient notes that his girlfriend was tested and was positive for trichomoniasis.  Patient requesting treatment here today.  Patient also notes a rash that started yesterday on his penis itchy with vesicles.  No history of the same.  No fever, no rash anywhere else.  No purulent discharge from his penis  No past medical history on file.  There are no active problems to display for this patient.   No past surgical history on file.      Home Medications    Prior to Admission medications   Medication Sig Start Date End Date Taking? Authorizing Provider  acyclovir (ZOVIRAX) 400 MG tablet Take 1 tablet (400 mg total) by mouth 3 (three) times daily for 7 days. 09/18/18 09/25/18  Hazel Wrinkle, Tinnie Gens, PA-C  albuterol (PROVENTIL HFA;VENTOLIN HFA) 108 (90 BASE) MCG/ACT inhaler Inhale 2 puffs into the lungs every 6 (six) hours as needed for wheezing.    [provider]  cephALEXin (KEFLEX) 500 MG capsule Take 1 capsule (500 mg total) by mouth 4 (four) times daily. 01/16/14   Szekalski, Kaitlyn, PA-C  traMADol (ULTRAM) 50 MG tablet Take 1 tablet (50 mg total) by mouth every 6 (six) hours as needed. 02/01/14   Kirichenko, Tatyana, PA-C  traMADol (ULTRAM) 50 MG tablet Take 1 tablet (50 mg total) by mouth every 6 (six) hours as needed. 12/09/14   Harle Battiest, NP    Family History No family history on file.  Social History Social History   Tobacco Use  . Smoking status: Current Every Day Smoker    Types: Cigarettes  . Smokeless tobacco: Never Used  Substance Use Topics  . Alcohol use: Yes  . Drug use: Yes    Types: Marijuana     Allergies   Patient has no known allergies.   Review of  Systems Review of Systems  All other systems reviewed and are negative.    Physical Exam Updated Vital Signs BP 111/71 (BP Location: Right Arm)   Pulse 90   Temp 98.1 F (36.7 C) (Oral)   Resp 15   Ht 5\' 9"  (1.753 m)   SpO2 100%   BMI 22.15 kg/m   Physical Exam Vitals signs and nursing note reviewed.  Constitutional:      Appearance: He is well-developed.  HENT:     Head: Normocephalic and atraumatic.  Eyes:     General: No scleral icterus.       Right eye: No discharge.        Left eye: No discharge.     Conjunctiva/sclera: Conjunctivae normal.     Pupils: Pupils are equal, round, and reactive to light.  Neck:     Musculoskeletal: Normal range of motion.     Vascular: No JVD.     Trachea: No tracheal deviation.  Pulmonary:     Effort: Pulmonary effort is normal.     Breath sounds: No stridor.  Genitourinary:    Comments: Vesicular rash noted to the right distal shaft Neurological:     Mental Status: He is alert and oriented to person, place, and time.     Coordination: Coordination normal.  Psychiatric:  Behavior: Behavior normal.        Thought Content: Thought content normal.        Judgment: Judgment normal.      ED Treatments / Results  Labs (all labs ordered are listed, but only abnormal results are displayed) Labs Reviewed  GC/CHLAMYDIA PROBE AMP ([place]) NOT AT [facility]    EKG None  Radiology No results found.  Procedures Procedures (including critical care time)  Medications Ordered in ED Medications  metroNIDAZOLE (FLAGYL) tablet 2,000 mg (2,000 mg Oral Given 09/18/18 1220)     Initial Impression / Assessment and Plan / ED Course  I have reviewed the triage vital signs and the nursing notes.  Pertinent labs & imaging results that were available during my care of the patient were reviewed by me and considered in my medical decision making (see chart for details).     28 year old male presents today with likely genital  herpes.  Patient has partner positive for trichomoniasis, he will be treated for trichomoniasis, HSV.  Return precautions given.  Verbalized understanding and agreement to today's plan.  Final Clinical Impressions(s) / ED Diagnoses   Final diagnoses:  Herpes simplex infection of penis    ED Discharge Orders         Ordered    acyclovir (ZOVIRAX) 400 MG tablet  3 times daily     09/18/18 1213           Eyvonne Mechanic, PA-C 09/18/18 1541    Wynetta Fines, MD 09/18/18 1820

## 2018-09-18 NOTE — ED Triage Notes (Signed)
Pt to ED for STD testing, girlfriend diagnosed with trich recently. Says he has little bumps in his groin area.

## 2018-09-18 NOTE — ED Notes (Signed)
Patient verbalized understanding of discharge instructions and denies any further needs or questions at this time. VS stable. Patient ambulatory with steady gait.  

## 2018-09-18 NOTE — Discharge Instructions (Addendum)
Please read attached information. If you experience any new or worsening signs or symptoms please return to the emergency room for evaluation. Please follow-up with your primary care provider or specialist as discussed. Please use medication prescribed only as directed and discontinue taking if you have any concerning signs or symptoms.   °

## 2018-09-19 LAB — GC/CHLAMYDIA PROBE AMP (~~LOC~~) NOT AT ARMC
Chlamydia: POSITIVE — AB
Neisseria Gonorrhea: NEGATIVE

## 2018-09-21 ENCOUNTER — Telehealth: Payer: Self-pay | Admitting: Student

## 2018-09-21 DIAGNOSIS — A749 Chlamydial infection, unspecified: Secondary | ICD-10-CM

## 2018-09-21 MED ORDER — AZITHROMYCIN 500 MG PO TABS: 1000.0000 mg | ORAL_TABLET | Freq: Once | ORAL | 0 refills | Status: AC

## 2018-09-21 NOTE — Telephone Encounter (Addendum)
Dwayne Sanchez tested positive for  Chlamydia. Patient was called by RN and allergies and pharmacy confirmed. Rx sent to pharmacy of choice.   Judeth Horn, NP 09/21/2018 10:16 AM       ----- Message from Kathe Becton, RN sent at 09/21/2018 10:12 AM EST ----- This patient tested positive for:  Chlamydia  He :"has NKDA",I have informed the patient of his results and confirmed his pharmacy is correct in his chart. Please send Rx.   Thank you,   Kathe Becton, RN   Results faxed to [facility] Department.

## 2019-07-15 ENCOUNTER — Other Ambulatory Visit: Payer: Self-pay

## 2019-07-15 ENCOUNTER — Emergency Department (HOSPITAL_COMMUNITY): Payer: Medicaid Other

## 2019-07-15 ENCOUNTER — Emergency Department (HOSPITAL_COMMUNITY)
Admission: EM | Admit: 2019-07-15 | Discharge: 2019-07-16 | Disposition: A | Discharge status: Home / Self Care | Payer: Medicaid Other | Attending: Emergency Medicine | Admitting: Emergency Medicine

## 2019-07-15 DIAGNOSIS — Z5321 Procedure and treatment not carried out due to patient leaving prior to being seen by health care provider: Secondary | ICD-10-CM | POA: Insufficient documentation
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
## 2019-07-16 NOTE — ED Notes (Signed)
No answer for room 

## 2019-07-28 ENCOUNTER — Encounter: Payer: Self-pay | Admitting: Emergency Medicine

## 2019-07-28 ENCOUNTER — Other Ambulatory Visit: Payer: Self-pay

## 2019-07-28 ENCOUNTER — Emergency Department
Admission: EM | Admit: 2019-07-28 | Discharge: 2019-07-28 | Disposition: A | Discharge status: Home / Self Care | Payer: Medicaid Other | Attending: Emergency Medicine | Admitting: Emergency Medicine

## 2019-07-28 DIAGNOSIS — X58XXXA Exposure to other specified factors, initial encounter: Secondary | ICD-10-CM | POA: Insufficient documentation

## 2019-07-28 DIAGNOSIS — Z79899 Other long term (current) drug therapy: Secondary | ICD-10-CM | POA: Insufficient documentation

## 2019-07-28 DIAGNOSIS — Y999 Unspecified external cause status: Secondary | ICD-10-CM | POA: Insufficient documentation

## 2019-07-28 DIAGNOSIS — Y929 Unspecified place or not applicable: Secondary | ICD-10-CM | POA: Insufficient documentation

## 2019-07-28 DIAGNOSIS — Y939 Activity, unspecified: Secondary | ICD-10-CM | POA: Insufficient documentation

## 2019-07-28 DIAGNOSIS — S6991XA Unspecified injury of right wrist, hand and finger(s), initial encounter: Secondary | ICD-10-CM

## 2019-07-28 DIAGNOSIS — F17210 Nicotine dependence, cigarettes, uncomplicated: Secondary | ICD-10-CM | POA: Insufficient documentation

## 2019-07-28 NOTE — ED Provider Notes (Signed)
[facility] Emergency Department Provider Note   ____________________________________________   First MD Initiated Contact with Patient 07/28/19 0215     (approximate)  I have reviewed the triage vital signs and the nursing notes.   HISTORY  Chief Complaint Hand Pain    HPI Dwayne Sanchez is a 28 y.o. male who presents to the ED from home requesting a splint to keep his right fifth digit straight.  States he "did something to it" a couple of weeks ago.  He is right-hand dominant.  Voices no other complaints or injuries.       Past medical history None   There are no active problems to display for this patient.   History reviewed. No pertinent surgical history.  Prior to Admission medications   Medication Sig Start Date End Date Taking? Authorizing Provider  albuterol (PROVENTIL HFA;VENTOLIN HFA) 108 (90 BASE) MCG/ACT inhaler Inhale 2 puffs into the lungs every 6 (six) hours as needed for wheezing.    [provider]  cephALEXin (KEFLEX) 500 MG capsule Take 1 capsule (500 mg total) by mouth 4 (four) times daily. 01/16/14   Szekalski, Kaitlyn, PA-C  traMADol (ULTRAM) 50 MG tablet Take 1 tablet (50 mg total) by mouth every 6 (six) hours as needed. 02/01/14   Kirichenko, Tatyana, PA-C  traMADol (ULTRAM) 50 MG tablet Take 1 tablet (50 mg total) by mouth every 6 (six) hours as needed. 12/09/14   Harle Battiest, NP    Allergies Patient has no known allergies.  History reviewed. No pertinent family history.  Social History Social History   Tobacco Use  . Smoking status: Current Every Day Smoker    Types: Cigarettes  . Smokeless tobacco: Never Used  Substance Use Topics  . Alcohol use: Yes  . Drug use: Yes    Types: Marijuana    Review of Systems  Constitutional: No fever/chills Eyes: No visual changes. ENT: No sore throat. Cardiovascular: Denies chest pain. Respiratory: Denies shortness of breath. Gastrointestinal: No  abdominal pain.  No nausea, no vomiting.  No diarrhea.  No constipation. Genitourinary: Negative for dysuria. Musculoskeletal: Positive for right fifth digit injury.  Negative for back pain. Skin: Negative for rash. Neurological: Negative for headaches, focal weakness or numbness.   ____________________________________________   PHYSICAL EXAM:  VITAL SIGNS: ED Triage Vitals  Enc Vitals Group     BP 07/28/19 0041 118/86     Pulse Rate 07/28/19 0041 95     Resp 07/28/19 0041 16     Temp 07/28/19 0041 98.8 F (37.1 C)     Temp Source 07/28/19 0041 Oral     SpO2 07/28/19 0041 98 %     Weight 07/28/19 0043 175 lb (79.4 kg)     Height 07/28/19 0043 5\' 11"  (1.803 m)     Head Circumference --      Peak Flow --      Pain Score 07/28/19 0043 7     Pain Loc --      Pain Edu? --      Excl. in GC? --     Constitutional: Alert and oriented. Well appearing and in no acute distress. Eyes: Conjunctivae are normal. PERRL. EOMI. Head: Atraumatic. Nose: No congestion/rhinnorhea. Mouth/Throat: Mucous membranes are moist.  Oropharynx non-erythematous. Neck: No stridor.   Cardiovascular: Normal rate, regular rhythm. Grossly normal heart sounds.  Good peripheral circulation. Respiratory: Normal respiratory effort.  No retractions. Lungs CTAB. Gastrointestinal: Soft and nontender. No distention. No abdominal bruits. No CVA  tenderness. Musculoskeletal:  Right fifth digit flexed at DIP joint.  Able to extend passively.  2+ radial pulse.  Brisk, less than 5-second capillary refill. No lower extremity tenderness nor edema.  No joint effusions. Neurologic:  Normal speech and language. No gross focal neurologic deficits are appreciated. No gait instability. Skin:  Skin is warm, dry and intact. No rash noted. Psychiatric: Mood and affect are normal. Speech and behavior are normal.  ____________________________________________   LABS (all labs ordered are listed, but only abnormal results are  displayed)  Labs Reviewed - No data to display ____________________________________________  EKG  None ____________________________________________  RADIOLOGY  ED MD interpretation:  None  Official radiology report(s): No results found.  Right little finger x-ray performed at [facility] on 11/9 interpreted per Dr. Westley Chandler:  Probable avulsion injury on the dorsal surface of the middle phalanx , with a slight flexion deformity which could be due to tendon injury. If further evaluation is required, would recommend MRI. ____________________________________________   PROCEDURES  Procedure(s) performed (including Critical Care):  Procedures   ____________________________________________   INITIAL IMPRESSION / ASSESSMENT AND PLAN / ED COURSE  As part of my medical decision making, I reviewed the following data within the [place] notes reviewed and incorporated, Old chart reviewed, Radiograph reviewed and Notes from prior ED visits     Dwayne Sanchez was evaluated in Emergency Department on 07/28/2019 for the symptoms described in the history of present illness. He was evaluated in the context of the global COVID-19 pandemic, which necessitated consideration that the patient might be at risk for infection with the SARS-CoV-2 virus that causes COVID-19. Institutional protocols and algorithms that pertain to the evaluation of patients at risk for COVID-19 are in a state of rapid change based on information released by regulatory bodies including the CDC and federal and state organizations. These policies and algorithms were followed during the patient's care in the ED.   28 year old male who presents to the ED requesting a splint for his right pinky finger injury.  I personally reviewed patient's records and see that he was at [facility] on 11/9.  He had an x-ray which demonstrated the above findings.  He left without being seen and therefore has  not followed up with orthopedic/hand surgery.  Will place finger splint.  I stressed to the patient the importance of following up with the specialist for further evaluation/outpatient MRI as he demonstrates tendonous damage and may face permanent disability of his right fifth digit.  Strict return precautions given.  Patient verbalizes understanding and agrees with plan of care.      ____________________________________________   FINAL CLINICAL IMPRESSION(S) / ED DIAGNOSES  Final diagnoses:  Finger injury, right, initial encounter     ED Discharge Orders    None       Note:  This document was prepared using Dragon voice recognition software and may include unintentional dictation errors.   Paulette Blanch, MD 07/28/19 [phone]

## 2019-07-28 NOTE — ED Notes (Signed)
Pt. Going home with girlfriend

## 2019-07-28 NOTE — ED Triage Notes (Signed)
Pt to ED from home c/o right 5th finger pain.  States just needs "finger splint to keep it straight, I've already had all the scans and what not."  States did "something to it a couple weeks ago I don't even know".

## 2019-07-28 NOTE — Discharge Instructions (Signed)
You have injured the tendon to your right fifth finger.  Wear splint as needed for comfort.  I encourage you to follow-up with the hand doctor for further evaluation.  Return to the ER for worsening symptoms, persistent vomiting, difficulty breathing or other concerns.

## 2020-02-08 ENCOUNTER — Other Ambulatory Visit: Payer: Self-pay

## 2020-02-08 ENCOUNTER — Emergency Department (HOSPITAL_COMMUNITY)
Admission: EM | Admit: 2020-02-08 | Discharge: 2020-02-08 | Discharge status: Against Medical Advice | Payer: Medicaid Other

## 2020-02-08 NOTE — ED Notes (Signed)
Call pt x6 no answer to be traige

## 2020-04-08 ENCOUNTER — Encounter (HOSPITAL_COMMUNITY): Payer: Self-pay

## 2020-04-08 ENCOUNTER — Emergency Department (HOSPITAL_COMMUNITY)
Admission: EM | Admit: 2020-04-08 | Discharge: 2020-04-08 | Discharge status: Against Medical Advice | Payer: Medicaid Other

## 2020-04-08 ENCOUNTER — Ambulatory Visit (HOSPITAL_COMMUNITY)
Admission: EM | Admit: 2020-04-08 | Discharge: 2020-04-08 | Disposition: A | Discharge status: Home / Self Care | Payer: Medicaid Other

## 2020-04-08 DIAGNOSIS — S61011A Laceration without foreign body of right thumb without damage to nail, initial encounter: Secondary | ICD-10-CM

## 2020-04-08 MED ORDER — LIDOCAINE HCL 2 % IJ SOLN
INTRAMUSCULAR | Status: AC
  Filled 2020-04-08: qty 20

## 2020-04-08 NOTE — Discharge Instructions (Addendum)
We recommended stiches but you declined This may have delayed healing.  Cleaned and dressed here today.  Follow up as needed for continued or worsening symptoms

## 2020-04-08 NOTE — ED Notes (Signed)
Patient shows admission at [facility].  Called [facility] to discharge patient so we can see him.  Called patient back from lobby, patient not answering and not found in lobby or outside.

## 2020-04-08 NOTE — ED Notes (Signed)
Urgent care called stating patient was up there to be seen.

## 2020-04-08 NOTE — ED Triage Notes (Signed)
Pt present to UC for laceration to right thumb. Bleeding controlled by gauze and coban at this time. Pt states laceration is at tip of thumb. Pt endorsing pain at site. Pt denies OTC treatments or relieving factors.
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
## 2022-02-27 ENCOUNTER — Encounter (HOSPITAL_COMMUNITY): Payer: Self-pay

## 2022-02-27 ENCOUNTER — Emergency Department (HOSPITAL_COMMUNITY)
Admission: EM | Admit: 2022-02-27 | Discharge: 2022-02-27 | Discharge status: Against Medical Advice | Payer: Medicaid Other | Attending: Emergency Medicine | Admitting: Emergency Medicine

## 2022-02-27 ENCOUNTER — Other Ambulatory Visit: Payer: Self-pay

## 2022-02-27 DIAGNOSIS — S20311A Abrasion of right front wall of thorax, initial encounter: Secondary | ICD-10-CM | POA: Insufficient documentation

## 2022-02-27 DIAGNOSIS — W3311XA Accidental malfunction of shotgun, initial encounter: Secondary | ICD-10-CM | POA: Insufficient documentation

## 2022-02-27 DIAGNOSIS — Z5321 Procedure and treatment not carried out due to patient leaving prior to being seen by health care provider: Secondary | ICD-10-CM | POA: Insufficient documentation

## 2022-02-27 DIAGNOSIS — S50811A Abrasion of right forearm, initial encounter: Secondary | ICD-10-CM | POA: Insufficient documentation
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
## 2022-05-02 ENCOUNTER — Ambulatory Visit (HOSPITAL_COMMUNITY)
Admission: EM | Admit: 2022-05-02 | Discharge: 2022-05-02 | Disposition: A | Discharge status: Home / Self Care | Payer: No Payment, Other

## 2022-05-02 ENCOUNTER — Encounter (HOSPITAL_COMMUNITY): Payer: Self-pay | Admitting: Registered Nurse

## 2022-05-02 NOTE — BH Assessment (Signed)
Pt reports his PO instructed him to come here to get back on his bipolar medications. Pt has been off medications for about 2-3 years. Pt denies SI, HI, AVH, SIB and alcohol and drug use. Pt given the option to return tomorrow for open access or stay for crisis assessment. Patient denies being in a crisis. Patient decided to return tomorrow for open access. Patient signed waiver and TTS informed patient or return precautions.

## 2022-06-21 ENCOUNTER — Ambulatory Visit (HOSPITAL_COMMUNITY)
Admission: RE | Admit: 2022-06-21 | Discharge: 2022-06-21 | Disposition: A | Discharge status: Home / Self Care | Payer: Self-pay | Source: Ambulatory Visit | Attending: Emergency Medicine | Admitting: Emergency Medicine

## 2022-06-21 ENCOUNTER — Other Ambulatory Visit: Payer: Self-pay

## 2022-06-21 ENCOUNTER — Ambulatory Visit (INDEPENDENT_AMBULATORY_CARE_PROVIDER_SITE_OTHER): Payer: Self-pay

## 2022-06-21 ENCOUNTER — Encounter (HOSPITAL_COMMUNITY): Payer: Self-pay

## 2022-06-21 VITALS — BP 123/81 | HR 97 | Temp 98.5°F | Resp 18

## 2022-06-21 DIAGNOSIS — S6391XA Sprain of unspecified part of right wrist and hand, initial encounter: Secondary | ICD-10-CM

## 2022-06-21 DIAGNOSIS — M79644 Pain in right finger(s): Secondary | ICD-10-CM

## 2022-06-21 DIAGNOSIS — W2209XA Striking against other stationary object, initial encounter: Secondary | ICD-10-CM
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
## 2022-06-21 NOTE — Discharge Instructions (Addendum)
You may continue to use ice management of pain.  I have given you an Ace bandage in office to help with compression.   Some information on rest, ice, compression, and elevation  are attached to your paperwork.   If symptoms do not improve or worsen you may follow-up with Ortho, information is attached your paperwork.
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
## 2022-06-21 NOTE — ED Notes (Signed)
One ace wrap applied by ngozi, np
